# Patient Record
Sex: Male | Born: 1950 | Race: White | Hispanic: No | Marital: Married | Smoking: Never smoker
Health system: Southern US, Community
[De-identification: ages and names within clinical notes are randomized; demographics above are authoritative.]

## PROBLEM LIST (undated history)

## (undated) DIAGNOSIS — E785 Hyperlipidemia, unspecified: Secondary | ICD-10-CM

## (undated) DIAGNOSIS — N529 Male erectile dysfunction, unspecified: Secondary | ICD-10-CM

## (undated) DIAGNOSIS — N400 Enlarged prostate without lower urinary tract symptoms: Secondary | ICD-10-CM

## (undated) DIAGNOSIS — K8020 Calculus of gallbladder without cholecystitis without obstruction: Secondary | ICD-10-CM

## (undated) DIAGNOSIS — M109 Gout, unspecified: Secondary | ICD-10-CM

## (undated) DIAGNOSIS — C4491 Basal cell carcinoma of skin, unspecified: Secondary | ICD-10-CM

## (undated) DIAGNOSIS — L130 Dermatitis herpetiformis: Secondary | ICD-10-CM

## (undated) DIAGNOSIS — Z7982 Long term (current) use of aspirin: Secondary | ICD-10-CM

## (undated) DIAGNOSIS — I4711 Inappropriate sinus tachycardia, so stated: Secondary | ICD-10-CM

## (undated) DIAGNOSIS — R7303 Prediabetes: Secondary | ICD-10-CM

## (undated) DIAGNOSIS — M75101 Unspecified rotator cuff tear or rupture of right shoulder, not specified as traumatic: Secondary | ICD-10-CM

## (undated) DIAGNOSIS — K648 Other hemorrhoids: Secondary | ICD-10-CM

## (undated) DIAGNOSIS — R739 Hyperglycemia, unspecified: Secondary | ICD-10-CM

## (undated) DIAGNOSIS — K5790 Diverticulosis of intestine, part unspecified, without perforation or abscess without bleeding: Secondary | ICD-10-CM

## (undated) DIAGNOSIS — K209 Esophagitis, unspecified without bleeding: Secondary | ICD-10-CM

## (undated) DIAGNOSIS — D171 Benign lipomatous neoplasm of skin and subcutaneous tissue of trunk: Secondary | ICD-10-CM

## (undated) DIAGNOSIS — R0602 Shortness of breath: Secondary | ICD-10-CM

## (undated) DIAGNOSIS — K760 Fatty (change of) liver, not elsewhere classified: Secondary | ICD-10-CM

## (undated) DIAGNOSIS — D751 Secondary polycythemia: Secondary | ICD-10-CM

## (undated) DIAGNOSIS — K2970 Gastritis, unspecified, without bleeding: Secondary | ICD-10-CM

## (undated) DIAGNOSIS — I10 Essential (primary) hypertension: Secondary | ICD-10-CM

## (undated) DIAGNOSIS — K219 Gastro-esophageal reflux disease without esophagitis: Secondary | ICD-10-CM

## (undated) DIAGNOSIS — R000 Tachycardia, unspecified: Secondary | ICD-10-CM

## (undated) DIAGNOSIS — I2510 Atherosclerotic heart disease of native coronary artery without angina pectoris: Secondary | ICD-10-CM

## (undated) DIAGNOSIS — I429 Cardiomyopathy, unspecified: Secondary | ICD-10-CM

## (undated) DIAGNOSIS — I519 Heart disease, unspecified: Secondary | ICD-10-CM

## (undated) DIAGNOSIS — R222 Localized swelling, mass and lump, trunk: Secondary | ICD-10-CM

## (undated) DIAGNOSIS — C801 Malignant (primary) neoplasm, unspecified: Secondary | ICD-10-CM

## (undated) DIAGNOSIS — M1990 Unspecified osteoarthritis, unspecified site: Secondary | ICD-10-CM

## (undated) DIAGNOSIS — R9439 Abnormal result of other cardiovascular function study: Secondary | ICD-10-CM

## (undated) DIAGNOSIS — D45 Polycythemia vera: Secondary | ICD-10-CM

## (undated) DIAGNOSIS — G4730 Sleep apnea, unspecified: Secondary | ICD-10-CM

## (undated) DIAGNOSIS — C439 Malignant melanoma of skin, unspecified: Secondary | ICD-10-CM

## (undated) HISTORY — DX: Sleep apnea, unspecified: G47.30

## (undated) HISTORY — DX: Dermatitis herpetiformis: L13.0

## (undated) HISTORY — DX: Esophagitis, unspecified without bleeding: K20.90

## (undated) HISTORY — PX: CARPAL TUNNEL RELEASE: SHX101

## (undated) HISTORY — DX: Hyperlipidemia, unspecified: E78.5

## (undated) HISTORY — DX: Secondary polycythemia: D75.1

## (undated) HISTORY — DX: Gout, unspecified: M10.9

## (undated) HISTORY — DX: Other hemorrhoids: K64.8

## (undated) HISTORY — DX: Gastritis, unspecified, without bleeding: K29.70

## (undated) HISTORY — DX: Diverticulosis of intestine, part unspecified, without perforation or abscess without bleeding: K57.90

## (undated) HISTORY — DX: Esophagitis, unspecified: K20.9

## (undated) HISTORY — DX: Hyperglycemia, unspecified: R73.9

## (undated) HISTORY — DX: Unspecified rotator cuff tear or rupture of right shoulder, not specified as traumatic: M75.101

## (undated) HISTORY — DX: Prediabetes: R73.03

## (undated) HISTORY — DX: Male erectile dysfunction, unspecified: N52.9

## (undated) HISTORY — DX: Inappropriate sinus tachycardia, so stated: I47.11

## (undated) HISTORY — DX: Tachycardia, unspecified: R00.0

## (undated) HISTORY — DX: Unspecified osteoarthritis, unspecified site: M19.90

## (undated) HISTORY — DX: Benign prostatic hyperplasia without lower urinary tract symptoms: N40.0

---

## 2004-10-20 ENCOUNTER — Ambulatory Visit: Payer: Self-pay | Admitting: Internal Medicine

## 2007-03-28 ENCOUNTER — Ambulatory Visit: Payer: Self-pay | Admitting: Unknown Physician Specialty

## 2007-03-28 HISTORY — PX: COLONOSCOPY WITH ESOPHAGOGASTRODUODENOSCOPY (EGD): SHX5779

## 2008-11-29 ENCOUNTER — Ambulatory Visit: Payer: Self-pay | Admitting: Internal Medicine

## 2011-08-21 ENCOUNTER — Ambulatory Visit: Payer: Self-pay | Admitting: Podiatry

## 2013-04-29 ENCOUNTER — Ambulatory Visit: Payer: Self-pay | Admitting: Internal Medicine

## 2013-04-29 LAB — CBC CANCER CENTER
Basophil #: 0.1 x10 3/mm (ref 0.0–0.1)
Basophil %: 1 %
Eosinophil #: 0.2 x10 3/mm (ref 0.0–0.7)
MCH: 30.3 pg (ref 26.0–34.0)
MCHC: 33.7 g/dL (ref 32.0–36.0)
MCV: 90 fL (ref 80–100)
Monocyte #: 0.5 x10 3/mm (ref 0.2–1.0)
Neutrophil %: 75.1 %
Platelet: 253 x10 3/mm (ref 150–440)
RDW: 14.1 % (ref 11.5–14.5)
WBC: 9.5 x10 3/mm (ref 3.8–10.6)

## 2013-04-29 LAB — IRON AND TIBC
Iron Bind.Cap.(Total): 389 ug/dL (ref 250–450)
Iron: 108 ug/dL (ref 65–175)

## 2013-05-19 LAB — CBC CANCER CENTER
Basophil #: 0.1 x10 3/mm (ref 0.0–0.1)
Basophil %: 1.4 %
Eosinophil #: 0.2 x10 3/mm (ref 0.0–0.7)
Lymphocyte #: 1.4 x10 3/mm (ref 1.0–3.6)
Lymphocyte %: 20.5 %
MCH: 30.5 pg (ref 26.0–34.0)
MCHC: 33.6 g/dL (ref 32.0–36.0)
MCV: 91 fL (ref 80–100)
Monocyte %: 8.6 %
RBC: 5 10*6/uL (ref 4.40–5.90)
WBC: 6.8 x10 3/mm (ref 3.8–10.6)

## 2013-05-23 ENCOUNTER — Ambulatory Visit: Payer: Self-pay | Admitting: Internal Medicine

## 2013-06-09 LAB — CANCER CENTER HEMATOCRIT: HCT: 49.5 % (ref 40.0–52.0)

## 2013-06-22 ENCOUNTER — Ambulatory Visit: Payer: Self-pay | Admitting: Internal Medicine

## 2013-06-30 ENCOUNTER — Ambulatory Visit: Payer: Self-pay | Admitting: Internal Medicine

## 2013-07-23 ENCOUNTER — Ambulatory Visit: Payer: Self-pay | Admitting: Internal Medicine

## 2013-08-11 LAB — CANCER CENTER HEMATOCRIT: HCT: 49.7 % (ref 40.0–52.0)

## 2013-08-23 ENCOUNTER — Ambulatory Visit: Payer: Self-pay | Admitting: Internal Medicine

## 2013-08-23 ENCOUNTER — Ambulatory Visit: Payer: Self-pay

## 2013-09-01 LAB — CANCER CENTER HEMATOCRIT: HCT: 47.2 % (ref 40.0–52.0)

## 2013-09-20 ENCOUNTER — Ambulatory Visit: Payer: Self-pay | Admitting: Internal Medicine

## 2013-09-22 LAB — CBC CANCER CENTER
BASOS ABS: 0 x10 3/mm (ref 0.0–0.1)
Basophil %: 0.3 %
EOS ABS: 0.2 x10 3/mm (ref 0.0–0.7)
Eosinophil %: 3.6 %
HCT: 47.1 % (ref 40.0–52.0)
HGB: 15.2 g/dL (ref 13.0–18.0)
LYMPHS ABS: 1.6 x10 3/mm (ref 1.0–3.6)
Lymphocyte %: 25.3 %
MCH: 27.3 pg (ref 26.0–34.0)
MCHC: 32.2 g/dL (ref 32.0–36.0)
MCV: 85 fL (ref 80–100)
MONOS PCT: 8.8 %
Monocyte #: 0.6 x10 3/mm (ref 0.2–1.0)
NEUTROS ABS: 4 x10 3/mm (ref 1.4–6.5)
NEUTROS PCT: 62 %
Platelet: 273 x10 3/mm (ref 150–440)
RBC: 5.57 10*6/uL (ref 4.40–5.90)
RDW: 13.7 % (ref 11.5–14.5)
WBC: 6.5 x10 3/mm (ref 3.8–10.6)

## 2013-10-13 LAB — CANCER CENTER HEMATOCRIT: HCT: 43.7 % (ref 40.0–52.0)

## 2013-10-21 ENCOUNTER — Ambulatory Visit: Payer: Self-pay | Admitting: Internal Medicine

## 2013-11-03 LAB — CANCER CENTER HEMATOCRIT: HCT: 43.1 % (ref 40.0–52.0)

## 2013-11-20 ENCOUNTER — Ambulatory Visit: Payer: Self-pay | Admitting: Internal Medicine

## 2013-11-24 LAB — CANCER CENTER HEMATOCRIT: HCT: 43.5 % (ref 40.0–52.0)

## 2013-12-15 LAB — CANCER CENTER HEMATOCRIT: HCT: 43.2 % (ref 40.0–52.0)

## 2013-12-21 ENCOUNTER — Ambulatory Visit: Payer: Self-pay | Admitting: Internal Medicine

## 2014-01-05 LAB — CANCER CENTER HEMATOCRIT: HCT: 45.1 % (ref 40.0–52.0)

## 2014-01-20 ENCOUNTER — Ambulatory Visit: Payer: Self-pay | Admitting: Internal Medicine

## 2014-01-26 LAB — CANCER CENTER HEMATOCRIT: HCT: 46.6 % (ref 40.0–52.0)

## 2014-02-16 LAB — CANCER CENTER HEMATOCRIT: HCT: 45.3 % (ref 40.0–52.0)

## 2014-02-20 ENCOUNTER — Ambulatory Visit: Payer: Self-pay | Admitting: Internal Medicine

## 2014-03-09 LAB — CBC CANCER CENTER
Basophil #: 0.1 x10 3/mm (ref 0.0–0.1)
Basophil %: 1.5 %
Eosinophil #: 0.2 x10 3/mm (ref 0.0–0.7)
Eosinophil %: 3.1 %
HCT: 45 % (ref 40.0–52.0)
HGB: 14.5 g/dL (ref 13.0–18.0)
LYMPHS ABS: 1.6 x10 3/mm (ref 1.0–3.6)
Lymphocyte %: 24.3 %
MCH: 26.2 pg (ref 26.0–34.0)
MCHC: 32.2 g/dL (ref 32.0–36.0)
MCV: 81 fL (ref 80–100)
MONO ABS: 0.6 x10 3/mm (ref 0.2–1.0)
Monocyte %: 8.2 %
NEUTROS ABS: 4.3 x10 3/mm (ref 1.4–6.5)
NEUTROS PCT: 62.9 %
Platelet: 231 x10 3/mm (ref 150–440)
RBC: 5.54 10*6/uL (ref 4.40–5.90)
RDW: 16.9 % — ABNORMAL HIGH (ref 11.5–14.5)
WBC: 6.8 x10 3/mm (ref 3.8–10.6)

## 2014-03-23 ENCOUNTER — Ambulatory Visit: Payer: Self-pay | Admitting: Internal Medicine

## 2014-06-01 ENCOUNTER — Ambulatory Visit: Payer: Self-pay | Admitting: Internal Medicine

## 2014-06-01 LAB — CANCER CENTER HEMATOCRIT: HCT: 47.9 % (ref 40.0–52.0)

## 2014-06-22 ENCOUNTER — Ambulatory Visit: Payer: Self-pay | Admitting: Internal Medicine

## 2014-08-24 ENCOUNTER — Ambulatory Visit: Payer: Self-pay | Admitting: Internal Medicine

## 2014-08-24 LAB — CANCER CENTER HEMATOCRIT: HCT: 47.2 % (ref 40.0–52.0)

## 2014-09-21 ENCOUNTER — Ambulatory Visit
Admit: 2014-09-21 | Disposition: A | Discharge status: Home / Self Care | Payer: Self-pay | Attending: Internal Medicine | Admitting: Internal Medicine

## 2014-11-16 ENCOUNTER — Ambulatory Visit
Admit: 2014-11-16 | Disposition: A | Discharge status: Home / Self Care | Payer: Self-pay | Attending: Internal Medicine | Admitting: Internal Medicine

## 2014-11-16 LAB — HEMATOCRIT: HCT: 44.5 % (ref 40.0–52.0)

## 2015-01-31 ENCOUNTER — Telehealth: Payer: Self-pay | Admitting: *Deleted

## 2015-01-31 ENCOUNTER — Other Ambulatory Visit: Payer: Self-pay | Admitting: *Deleted

## 2015-01-31 DIAGNOSIS — D751 Secondary polycythemia: Secondary | ICD-10-CM

## 2015-01-31 NOTE — Telephone Encounter (Signed)
Pt called stating that the [facility] blood work did not work well from side of [place] and he will just come to cancer center and get labs drawn.  Orders entered for the appt 7/19. Pt aware that the time for labs is 8:45. And he will see md after and if he needs phlebotomy then he will get it after he sees md.

## 2015-02-08 ENCOUNTER — Inpatient Hospital Stay: Payer: 59

## 2015-02-08 ENCOUNTER — Inpatient Hospital Stay: Payer: 59 | Attending: Internal Medicine | Admitting: Internal Medicine

## 2015-02-08 VITALS — BP 135/86 | HR 67 | Temp 97.6°F | Resp 18 | Ht 65.0 in | Wt 207.4 lb

## 2015-02-08 DIAGNOSIS — R5383 Other fatigue: Secondary | ICD-10-CM | POA: Diagnosis not present

## 2015-02-08 DIAGNOSIS — E785 Hyperlipidemia, unspecified: Secondary | ICD-10-CM | POA: Diagnosis not present

## 2015-02-08 DIAGNOSIS — K760 Fatty (change of) liver, not elsewhere classified: Secondary | ICD-10-CM

## 2015-02-08 DIAGNOSIS — Z8719 Personal history of other diseases of the digestive system: Secondary | ICD-10-CM

## 2015-02-08 DIAGNOSIS — N400 Enlarged prostate without lower urinary tract symptoms: Secondary | ICD-10-CM | POA: Diagnosis not present

## 2015-02-08 DIAGNOSIS — M1990 Unspecified osteoarthritis, unspecified site: Secondary | ICD-10-CM | POA: Diagnosis not present

## 2015-02-08 DIAGNOSIS — Z8639 Personal history of other endocrine, nutritional and metabolic disease: Secondary | ICD-10-CM | POA: Diagnosis not present

## 2015-02-08 DIAGNOSIS — N529 Male erectile dysfunction, unspecified: Secondary | ICD-10-CM

## 2015-02-08 DIAGNOSIS — D751 Secondary polycythemia: Secondary | ICD-10-CM

## 2015-02-08 DIAGNOSIS — I10 Essential (primary) hypertension: Secondary | ICD-10-CM

## 2015-02-08 LAB — CBC WITH DIFFERENTIAL/PLATELET
BASOS ABS: 0.1 10*3/uL (ref 0–0.1)
Basophils Relative: 1 %
EOS ABS: 0.2 10*3/uL (ref 0–0.7)
Eosinophils Relative: 3 %
HEMATOCRIT: 48.1 % (ref 40.0–52.0)
HEMOGLOBIN: 15.4 g/dL (ref 13.0–18.0)
Lymphocytes Relative: 23 %
Lymphs Abs: 1.5 10*3/uL (ref 1.0–3.6)
MCH: 26.1 pg (ref 26.0–34.0)
MCHC: 32.1 g/dL (ref 32.0–36.0)
MCV: 81.5 fL (ref 80.0–100.0)
Monocytes Absolute: 0.5 10*3/uL (ref 0.2–1.0)
Monocytes Relative: 7 %
NEUTROS ABS: 4.2 10*3/uL (ref 1.4–6.5)
Neutrophils Relative %: 66 %
Platelets: 232 10*3/uL (ref 150–440)
RBC: 5.91 MIL/uL — AB (ref 4.40–5.90)
RDW: 16.1 % — AB (ref 11.5–14.5)
WBC: 6.4 10*3/uL (ref 3.8–10.6)

## 2015-02-26 NOTE — Progress Notes (Signed)
[place]  [phone] Fax:(336) [phone]     ID: Jorge Mose Goodroe Sr. 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  Started phlebotomy treatment on 04/29/13.  Labs done on 04/29/13 -  Hemoglobin 17.4, hematocrit 53%, WBC 9500 with unremarkable differential, platelets 253, iron study normal. Carboxyhemoglobin 0.9, serum EPO 6.3. [id number] mutation with reflex to exon 12 analysis negative. Ultrasound abdomen limited study reports mild hepatic steatosis, no hepatosplenomegaly.     HISTORY OF PRESENT ILLNESS:  Patient returns for continued hematology followup. He had hematocrit monitoredin-between and it has fluctuated in the range between 44.5-48.1 today. States that he does have chronic fatigue but otherwise denies any dizziness or lightheadedness after getting phlebotomy treatments. Denies any history of smoking or secondhand smoke exposure. States that he takes adequate oral fluids and water intake and denies any symptoms to suggest dehydration. He denies any known history of chronic lung disease or sleep apnea. Appetite is good. No new bone pains. Denies any headaches, other neurological symptoms or facial flushing.    REVIEW OF SYSTEMS:   ROS As in HPI above. In addition, no fever, chills or sweats. No new headaches or focal weakness.  No new mood disturbances. No  sore throat, cough, shortness of breath, sputum, hemoptysis or chest pain. No dizziness or palpitation. No abdominal pain, constipation, diarrhea, dysuria or hematuria. No new skin rash or bleeding symptoms. No new paresthesias in extremities.   PAST MEDICAL HISTORY: Reviewed.          Hypertension  Hyperlipidemia  Hyperglycemia/prediabetes  BPH  History of internal hemorrhoids  Osteoarthritis, right rotator cuff tear  Environmental allergies  Erectile dysfunction  History of esophagitis/gastritis  Diverticulosis  Gout  PAST SURGICAL HISTORY: Reviewed. As above  FAMILY HISTORY: Reviewed. Sister had CVA in her 65s, brother died from prostate cancer at age 5.  Denies hematological disorders including polycythemia  SOCIAL HISTORY: Reviewed. Denies smoking.  Occasional alcohol intake.  Denies recreational drug usage.  Physically active and ambulatory.   No current outpatient prescriptions on file.   No current facility-administered medications for this visit.    PHYSICAL EXAM: Filed Vitals:   02/08/15 0929  BP: 135/86  Pulse: 67  Temp: 97.6 F (36.4 C)  Resp: 18     Body mass index is 34.52 kg/(m^2).      GENERAL: Patient is alert and oriented and in no acute distress. There is no icterus. HEENT: EOMs intact. No cervical lymphadenopathy. CVS: S1S2, regular LUNGS: Bilaterally clear to auscultation, no rhonchi. ABDOMEN: Soft, nontender. No hepatosplenomegaly clinically.  EXTREMITIES: No pedal edema.  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 Also other possible etiology could be taking long hot steamy bath daily.  Started phlebotomy treatment on 04/29/13.  Otherwise workup done shows that Carboxyhemoglobin, serum EPO, [id number] mutation with reflex to exon 12 analysis are all unremarkable  -  Reviewed labs from today and recent, and d/w patient. Overall he is doing steady clinically,  hematocrit continues to remain in steady range. Patient agreeable to continue on current phlebotomy protocol. states that he wants to continue to keep slightly higher target hematocrit at 47 since phlebotomies do make him feel tired. He has not had any thromboembolic phenomena so far. Plan therefore is to continue to monitor hematocrit once every few weeks and will phlebotomy 300 mL if t is 47 or higher. Patient encouraged to take baby aspirin 81 mg daily. Next MD followup at 48 weeks with repeat labs and make further treatment planning.   In between visits, he was advised to call or come to ER in case of any new symptoms or acute sickness.  He is agreeable to this plan.    Leia Alf, MD   02/26/2015 11:10 AM

## 2015-05-31 ENCOUNTER — Inpatient Hospital Stay: Payer: 59 | Attending: Family Medicine

## 2015-05-31 ENCOUNTER — Inpatient Hospital Stay: Payer: 59

## 2015-05-31 DIAGNOSIS — D751 Secondary polycythemia: Secondary | ICD-10-CM | POA: Insufficient documentation

## 2015-05-31 LAB — HEMATOCRIT: HCT: 48.2 % (ref 40.0–52.0)

## 2015-09-02 IMAGING — US ABDOMEN ULTRASOUND LIMITED
1 series · 14 of 25 positions shown · non-contrast
Comparison: none

REASON FOR EXAM: Polycythemia  Eval Heptasplenomegaly
COMMENTS:

[Series 1: abdomen ultrasound limited · 0.31mm/px · 14 of 34 slices shown]
[im 1/34]
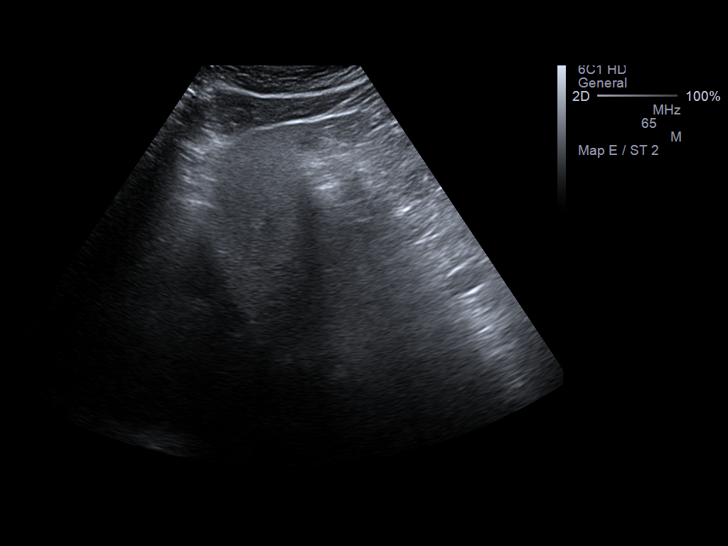
[im 3/34]
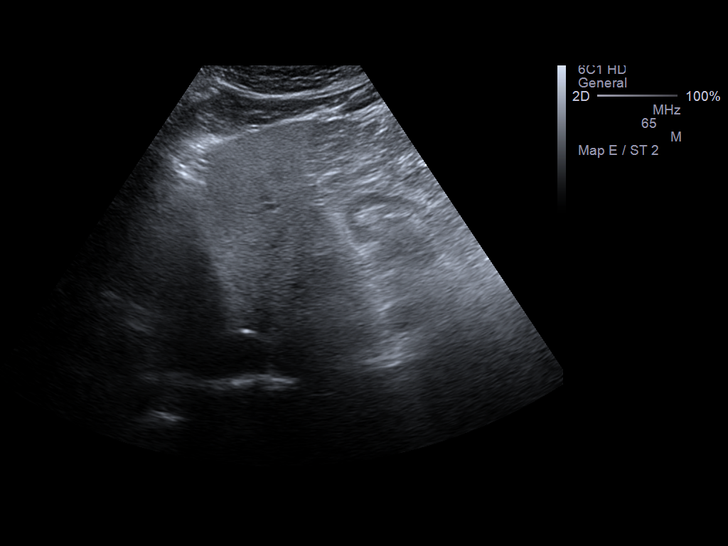
[im 6/34]
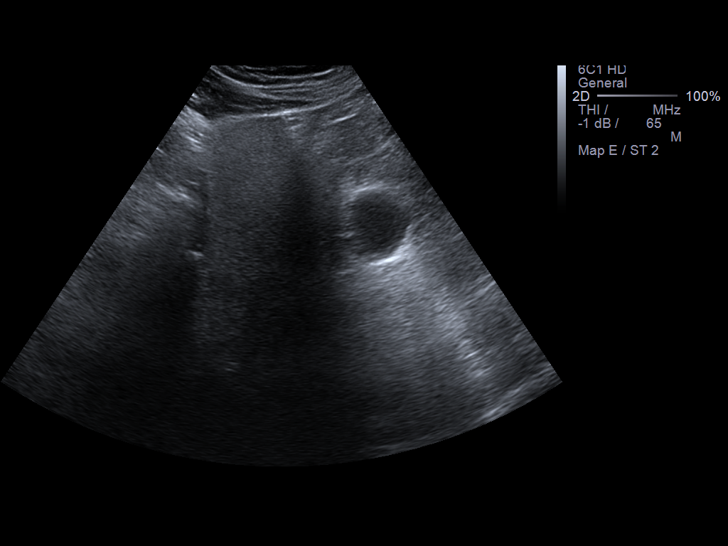
[im 9/34]
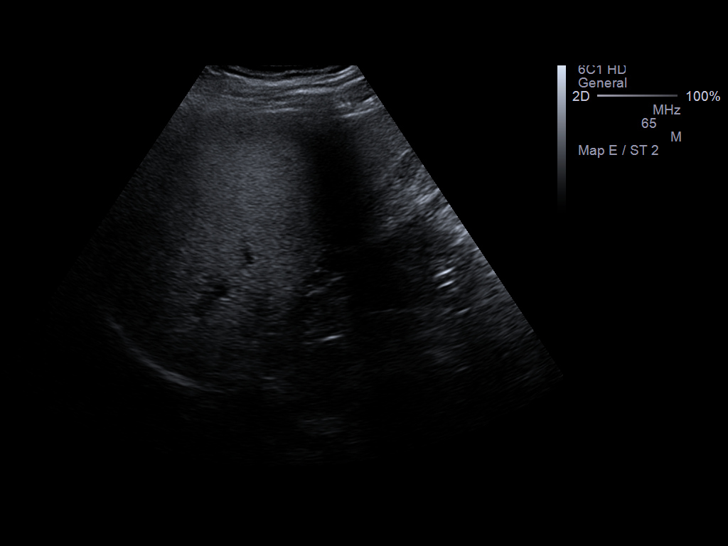
[im 12/34]
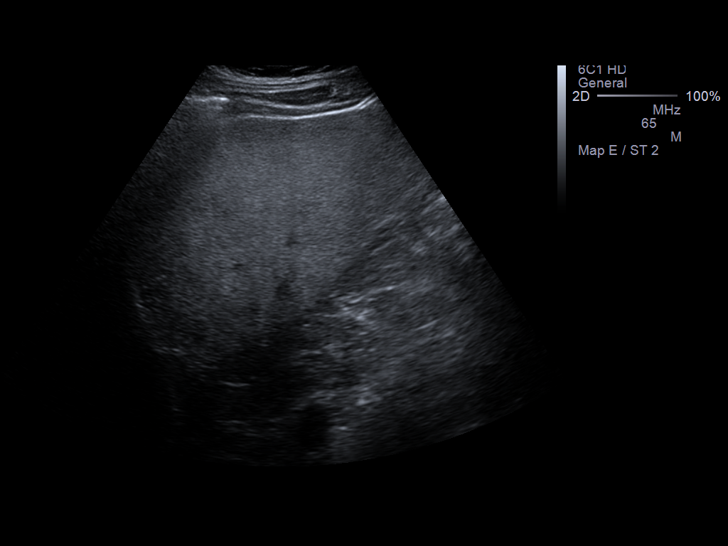
[im 13/34]
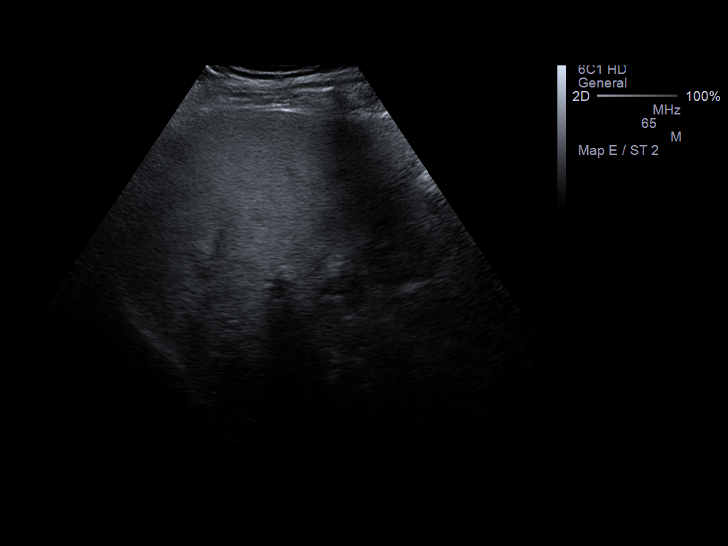
[im 16/34]
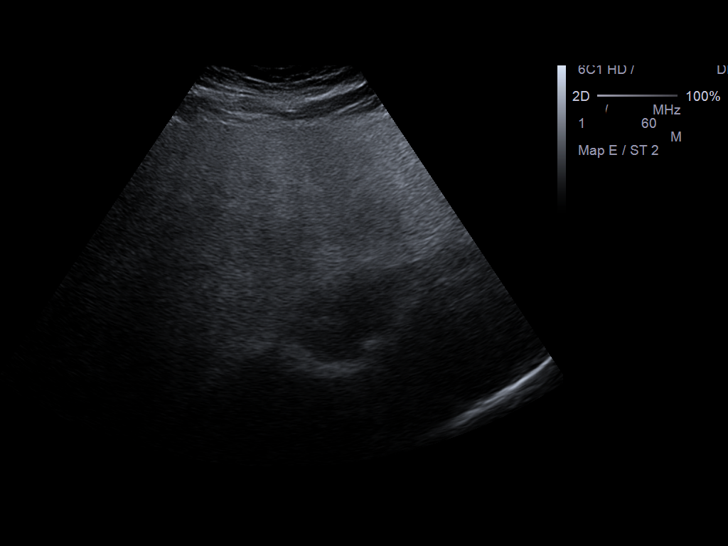
[im 18/34]
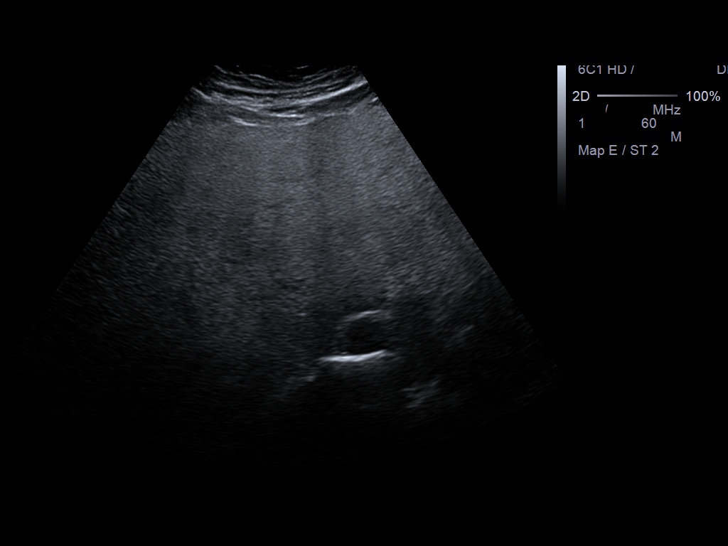
[im 21/34]
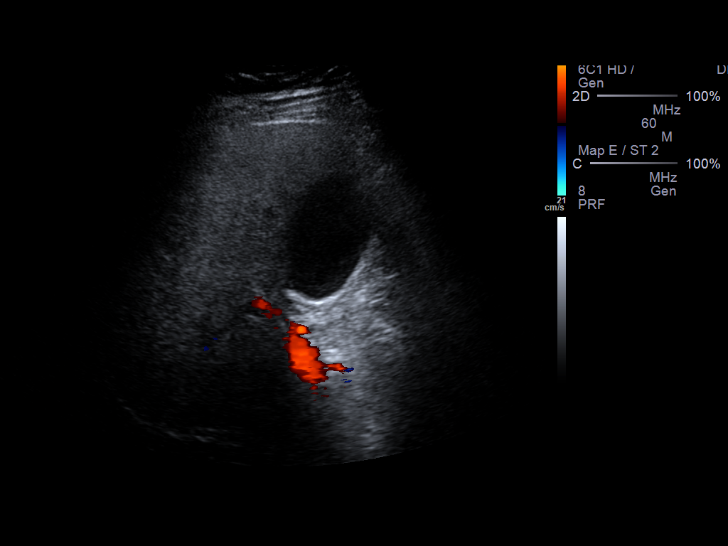
[im 23/34]
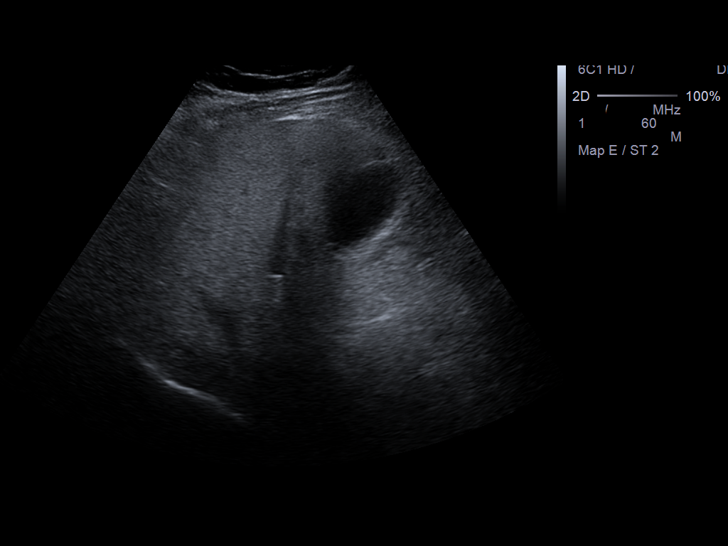
[im 25/34]
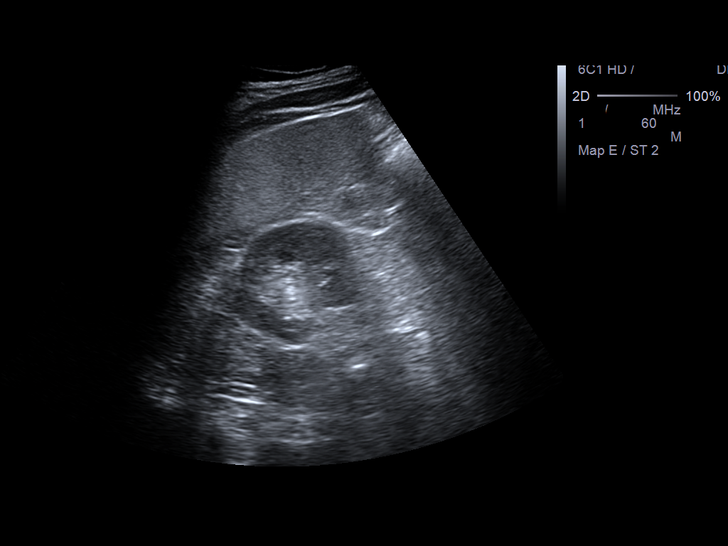
[im 28/34]
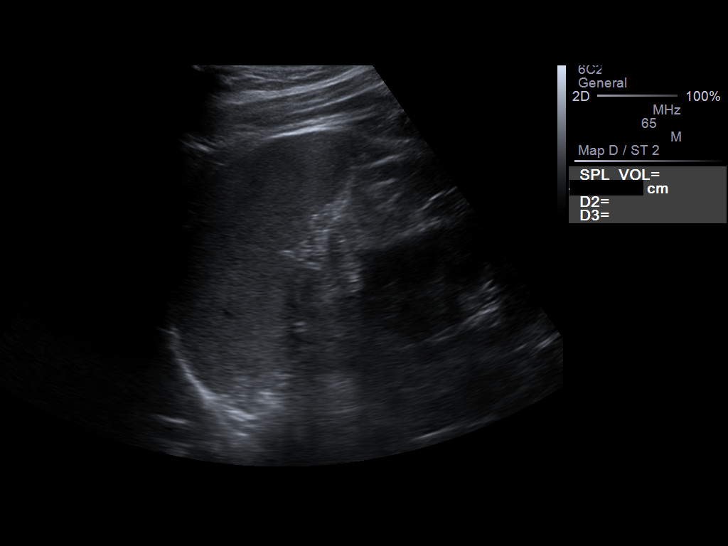
[im 31/34]
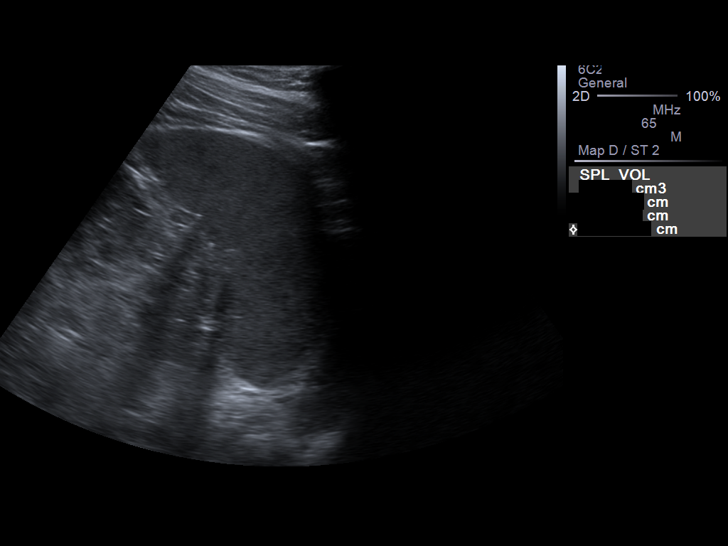
[im 34/34]
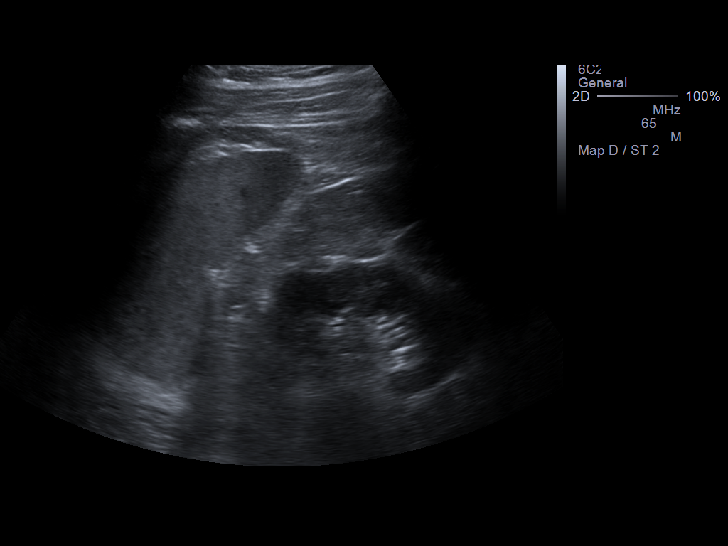

[14 of 25 positions shown; findings below may reference images not displayed]

PROCEDURE:     US  - US ABDOMEN LIMITED SURVEY  - May 01, 2013  [DATE]

RESULT:     Ultrasound of the abdomen to evaluate hepatosplenomegaly shows
the liver length is 17.17 cm. The echotexture is mildly increased. There is
no discrete mass or ductal dilation. The portal vein is patent with
appropriate flow. Spleen measures 8.04 x 4.25 x 10.51 cm and is homogeneous.
IMPRESSION: No evidence of hepatosplenomegaly. Mild hepatic steatosis.

[REDACTED]

## 2015-09-20 ENCOUNTER — Inpatient Hospital Stay: Payer: 59

## 2015-09-21 ENCOUNTER — Inpatient Hospital Stay: Payer: 59 | Attending: Internal Medicine | Admitting: *Deleted

## 2015-09-21 ENCOUNTER — Inpatient Hospital Stay: Payer: 59

## 2015-09-21 DIAGNOSIS — D751 Secondary polycythemia: Secondary | ICD-10-CM | POA: Diagnosis present

## 2015-09-21 LAB — HEMATOCRIT: HCT: 47.3 % (ref 40.0–52.0)

## 2015-09-21 NOTE — Progress Notes (Unsigned)
HCT 47.3 please see results

## 2016-01-10 ENCOUNTER — Ambulatory Visit: Payer: 59 | Admitting: Internal Medicine

## 2016-01-10 ENCOUNTER — Inpatient Hospital Stay: Payer: 59

## 2016-01-10 ENCOUNTER — Other Ambulatory Visit: Payer: Self-pay

## 2016-01-10 ENCOUNTER — Inpatient Hospital Stay: Payer: 59 | Attending: Family Medicine | Admitting: Family Medicine

## 2016-01-10 VITALS — BP 143/99 | HR 85 | Temp 99.1°F | Resp 16 | Wt 205.0 lb

## 2016-01-10 VITALS — BP 135/87 | HR 84 | Resp 18

## 2016-01-10 DIAGNOSIS — M109 Gout, unspecified: Secondary | ICD-10-CM | POA: Diagnosis not present

## 2016-01-10 DIAGNOSIS — K209 Esophagitis, unspecified without bleeding: Secondary | ICD-10-CM | POA: Insufficient documentation

## 2016-01-10 DIAGNOSIS — K2970 Gastritis, unspecified, without bleeding: Secondary | ICD-10-CM | POA: Insufficient documentation

## 2016-01-10 DIAGNOSIS — I10 Essential (primary) hypertension: Secondary | ICD-10-CM | POA: Diagnosis not present

## 2016-01-10 DIAGNOSIS — R739 Hyperglycemia, unspecified: Secondary | ICD-10-CM | POA: Diagnosis not present

## 2016-01-10 DIAGNOSIS — K5790 Diverticulosis of intestine, part unspecified, without perforation or abscess without bleeding: Secondary | ICD-10-CM | POA: Insufficient documentation

## 2016-01-10 DIAGNOSIS — D751 Secondary polycythemia: Secondary | ICD-10-CM | POA: Diagnosis present

## 2016-01-10 DIAGNOSIS — N529 Male erectile dysfunction, unspecified: Secondary | ICD-10-CM | POA: Diagnosis not present

## 2016-01-10 DIAGNOSIS — M1990 Unspecified osteoarthritis, unspecified site: Secondary | ICD-10-CM | POA: Insufficient documentation

## 2016-01-10 DIAGNOSIS — Z79899 Other long term (current) drug therapy: Secondary | ICD-10-CM | POA: Insufficient documentation

## 2016-01-10 DIAGNOSIS — K648 Other hemorrhoids: Secondary | ICD-10-CM | POA: Insufficient documentation

## 2016-01-10 DIAGNOSIS — N400 Enlarged prostate without lower urinary tract symptoms: Secondary | ICD-10-CM | POA: Insufficient documentation

## 2016-01-10 DIAGNOSIS — Z8546 Personal history of malignant neoplasm of prostate: Secondary | ICD-10-CM | POA: Diagnosis not present

## 2016-01-10 DIAGNOSIS — R5383 Other fatigue: Secondary | ICD-10-CM | POA: Insufficient documentation

## 2016-01-10 DIAGNOSIS — E785 Hyperlipidemia, unspecified: Secondary | ICD-10-CM | POA: Insufficient documentation

## 2016-01-10 LAB — CBC WITH DIFFERENTIAL/PLATELET
BASOS ABS: 0.1 10*3/uL (ref 0–0.1)
BASOS PCT: 1 %
Eosinophils Absolute: 0.2 10*3/uL (ref 0–0.7)
Eosinophils Relative: 3 %
HEMATOCRIT: 49.7 % (ref 40.0–52.0)
HEMOGLOBIN: 16.6 g/dL (ref 13.0–18.0)
Lymphocytes Relative: 26 %
Lymphs Abs: 1.7 10*3/uL (ref 1.0–3.6)
MCH: 27.5 pg (ref 26.0–34.0)
MCHC: 33.4 g/dL (ref 32.0–36.0)
MCV: 82.4 fL (ref 80.0–100.0)
MONOS PCT: 8 %
Monocytes Absolute: 0.5 10*3/uL (ref 0.2–1.0)
NEUTROS PCT: 62 %
Neutro Abs: 4 10*3/uL (ref 1.4–6.5)
Platelets: 245 10*3/uL (ref 150–440)
RBC: 6.03 MIL/uL — ABNORMAL HIGH (ref 4.40–5.90)
RDW: 16 % — AB (ref 11.5–14.5)
WBC: 6.5 10*3/uL (ref 3.8–10.6)

## 2016-01-10 NOTE — Progress Notes (Signed)
[facility]  [phone] [phone]) [phone]     ID: Jorge Mose Gumina Sr. 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  Started phlebotomy treatment on 04/29/13.  Labs done on 04/29/13 -  Hemoglobin 17.4, hematocrit 53%, WBC 9500 with unremarkable differential, platelets 253, iron study normal. Carboxyhemoglobin 0.9, serum EPO 6.3. [id number] mutation with reflex to exon 12 analysis negative. Ultrasound abdomen limited study reports mild hepatic steatosis, no hepatosplenomegaly.     HISTORY OF PRESENT ILLNESS:  Patient is here for further follow-up and treatment consideration regarding erythrocytosis/polycythemia. Patient was originally diagnosed in 2014 and started treatment under the direction of Dr. Ma Hillock. Dr. Ma Hillock and he established a goal hematocrit of around 47. Patient does report having some chronic fatigue but otherwise denies any complaints of dizziness, lightheadedness, or headaches following phlebotomies. Discussed with patient if he became dizzy that we could to a replacement of IV fluids. Patient also denies any shortness of breath, hemoptysis, lower extremity swelling. Overall he reports feeling very well and denies any acute complaints.  REVIEW OF SYSTEMS:   Review of Systems  Constitutional: Positive for malaise/fatigue. Negative for fever, chills, weight loss and diaphoresis.       At times  HENT: Negative.   Eyes: Negative.   Respiratory: Negative for cough, hemoptysis, sputum production, shortness of breath and wheezing.   Cardiovascular: Negative for chest pain, palpitations, orthopnea, claudication, leg swelling and PND.  Gastrointestinal: Negative for  heartburn, nausea, vomiting, abdominal pain, diarrhea, constipation, blood in stool and melena.  Genitourinary: Negative.   Musculoskeletal: Negative.   Skin: Negative.   Neurological: Negative for dizziness, tingling, focal weakness, seizures and weakness.  Endo/Heme/Allergies: Does not bruise/bleed easily.  Psychiatric/Behavioral: Negative for depression. The patient is not nervous/anxious and does not have insomnia.    As in HPI above. In addition, no fever, chills or sweats. No new headaches or focal weakness.  No new mood disturbances. No  sore throat, cough, shortness of breath, sputum, hemoptysis or chest pain. No dizziness or palpitation. No abdominal pain, constipation, diarrhea, dysuria or hematuria. No new skin rash or bleeding symptoms. No new paresthesias in extremities.   PAST MEDICAL HISTORY: Reviewed.         Hypertension  Hyperlipidemia  Hyperglycemia/prediabetes  BPH  History of internal hemorrhoids  Osteoarthritis, right rotator cuff tear  Environmental allergies  Erectile dysfunction  History of esophagitis/gastritis  Diverticulosis  Gout  PAST SURGICAL HISTORY: Reviewed. As above  FAMILY HISTORY: Reviewed. Sister had CVA in her 92s, brother died from prostate cancer at age 57.  Denies hematological disorders including polycythemia  SOCIAL HISTORY: Reviewed. Denies smoking.  Occasional alcohol intake.  Denies recreational drug usage.  Physically active and ambulatory.   Current Outpatient Prescriptions  Medication Sig Dispense Refill  . acetaminophen (TYLENOL) 500 MG tablet Take by mouth.    Marland Kitchen allopurinol (ZYLOPRIM) 300 MG tablet     . CIALIS 20 MG tablet TAKE 1 TABLET BY MOUTH ONCE DAILY AS NEEDED FOR ERECTILE DYSFUNCTION  5  . colchicine 0.6 MG tablet Take by mouth.    . finasteride (PROSCAR) 5 MG tablet     . omeprazole (PRILOSEC) 20 MG capsule      No current facility-administered medications for this visit.  PHYSICAL EXAM: Filed Vitals:    01/10/16 0958  BP: 143/99  Pulse: 85  Temp: 99.1 F (37.3 C)  Resp: 16     Body mass index is 34.11 kg/(m^2).      GENERAL: Patient is alert and oriented and in no acute distress. There is no icterus. HEENT: EOMs intact. No cervical lymphadenopathy. CVS: S1S2, regular LUNGS: Bilaterally clear to auscultation, no rhonchi. ABDOMEN: Soft, nontender. No hepatosplenomegaly clinically.  EXTREMITIES: No pedal edema.  LAB RESULTS: Lab Results  Component Value Date   WBC 6.5 01/10/2016   NEUTROABS 4.0 01/10/2016   HGB 16.6 01/10/2016   HCT 49.7 01/10/2016   MCV 82.4 01/10/2016   PLT 245 01/10/2016     ASSESSMENT / PLAN:   1. Progressive Erythrocytosis. On initial diagnosis hemoglobin 17.9 g/dL, hematocrit 52.3% on CBC done 04/17/13. 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 Started phlebotomy treatment on 04/29/13.  Otherwise workup done shows that Carboxyhemoglobin, serum EPO, [id number] mutation with reflex to exon 12 analysis are all unremarkable.  Reviewed labs from today and previous visits. Overall he is doing steady clinically, hematocrit continues to remain in steady range. Patient agreeable to continue on current phlebotomy protocol. We will continue with a target hematocrit at 47. Plan therefore is to continue to monitor hematocrit once every 12 weeks and will perform phlebotomy as 300 mL if hct is 47 or higher. Patient continues to take baby aspirin 81 mg daily.   Next MD followup in approximately one year with repeat labs and make further treatment planning.  Patient understands to call our office if he develops any worsening symptoms or concerns.  In between visits, he was advised to call or come to ER in case of any new symptoms or acute sickness.  He is agreeable to this plan.   Dr. Grayland Ormond was available for consultation and review of plan of care for this patient.  Evlyn Kanner, NP   01/10/2016  10:08 AM

## 2016-04-03 ENCOUNTER — Inpatient Hospital Stay: Payer: 59

## 2016-04-03 ENCOUNTER — Other Ambulatory Visit: Payer: Self-pay | Admitting: Oncology

## 2016-04-03 ENCOUNTER — Inpatient Hospital Stay: Payer: 59 | Attending: Internal Medicine

## 2016-04-03 VITALS — BP 131/87 | HR 90 | Temp 98.7°F | Resp 18

## 2016-04-03 DIAGNOSIS — D751 Secondary polycythemia: Secondary | ICD-10-CM | POA: Diagnosis not present

## 2016-04-03 LAB — CBC WITH DIFFERENTIAL/PLATELET
BASOS ABS: 0.1 10*3/uL (ref 0–0.1)
Basophils Relative: 1 %
Eosinophils Absolute: 0.2 10*3/uL (ref 0–0.7)
Eosinophils Relative: 2 %
HEMATOCRIT: 49.6 % (ref 40.0–52.0)
HEMOGLOBIN: 16.6 g/dL (ref 13.0–18.0)
LYMPHS ABS: 2.3 10*3/uL (ref 1.0–3.6)
LYMPHS PCT: 25 %
MCH: 27.4 pg (ref 26.0–34.0)
MCHC: 33.4 g/dL (ref 32.0–36.0)
MCV: 82 fL (ref 80.0–100.0)
Monocytes Absolute: 0.5 10*3/uL (ref 0.2–1.0)
Monocytes Relative: 5 %
NEUTROS ABS: 6.4 10*3/uL (ref 1.4–6.5)
NEUTROS PCT: 67 %
Platelets: 278 10*3/uL (ref 150–440)
RBC: 6.05 MIL/uL — AB (ref 4.40–5.90)
RDW: 15.3 % — ABNORMAL HIGH (ref 11.5–14.5)
WBC: 9.6 10*3/uL (ref 3.8–10.6)

## 2016-06-06 DIAGNOSIS — I5189 Other ill-defined heart diseases: Secondary | ICD-10-CM

## 2016-06-06 DIAGNOSIS — I429 Cardiomyopathy, unspecified: Secondary | ICD-10-CM

## 2016-06-06 HISTORY — DX: Other ill-defined heart diseases: I51.89

## 2016-06-06 HISTORY — DX: Cardiomyopathy, unspecified: I42.9

## 2016-06-18 DIAGNOSIS — I519 Heart disease, unspecified: Secondary | ICD-10-CM | POA: Insufficient documentation

## 2016-06-26 ENCOUNTER — Inpatient Hospital Stay: Payer: 59 | Attending: Internal Medicine

## 2016-06-26 ENCOUNTER — Inpatient Hospital Stay: Payer: 59

## 2016-06-26 VITALS — BP 110/72 | HR 112 | Resp 20

## 2016-06-26 DIAGNOSIS — D751 Secondary polycythemia: Secondary | ICD-10-CM | POA: Diagnosis present

## 2016-06-26 LAB — HEMATOCRIT: HEMATOCRIT: 48.3 % (ref 40.0–52.0)

## 2016-06-28 DIAGNOSIS — R0602 Shortness of breath: Secondary | ICD-10-CM | POA: Insufficient documentation

## 2016-06-28 DIAGNOSIS — G4733 Obstructive sleep apnea (adult) (pediatric): Secondary | ICD-10-CM | POA: Insufficient documentation

## 2016-07-25 DIAGNOSIS — I10 Essential (primary) hypertension: Secondary | ICD-10-CM | POA: Insufficient documentation

## 2016-09-18 ENCOUNTER — Inpatient Hospital Stay: Payer: 59 | Attending: Internal Medicine | Admitting: *Deleted

## 2016-09-18 ENCOUNTER — Other Ambulatory Visit: Payer: Self-pay | Admitting: Internal Medicine

## 2016-09-18 ENCOUNTER — Inpatient Hospital Stay: Payer: 59

## 2016-09-18 DIAGNOSIS — D751 Secondary polycythemia: Secondary | ICD-10-CM | POA: Insufficient documentation

## 2016-09-18 LAB — CBC WITH DIFFERENTIAL/PLATELET
Basophils Absolute: 0.1 10*3/uL (ref 0–0.1)
Basophils Relative: 1 %
Eosinophils Absolute: 0.3 10*3/uL (ref 0–0.7)
Eosinophils Relative: 4 %
HEMATOCRIT: 46.3 % (ref 40.0–52.0)
HEMOGLOBIN: 15.6 g/dL (ref 13.0–18.0)
LYMPHS ABS: 2.1 10*3/uL (ref 1.0–3.6)
Lymphocytes Relative: 29 %
MCH: 26.6 pg (ref 26.0–34.0)
MCHC: 33.7 g/dL (ref 32.0–36.0)
MCV: 78.9 fL — ABNORMAL LOW (ref 80.0–100.0)
Monocytes Absolute: 0.5 10*3/uL (ref 0.2–1.0)
Monocytes Relative: 7 %
NEUTROS ABS: 4.2 10*3/uL (ref 1.4–6.5)
NEUTROS PCT: 59 %
Platelets: 306 10*3/uL (ref 150–440)
RBC: 5.87 MIL/uL (ref 4.40–5.90)
RDW: 15.5 % — ABNORMAL HIGH (ref 11.5–14.5)
WBC: 7.2 10*3/uL (ref 3.8–10.6)

## 2016-09-18 NOTE — Progress Notes (Signed)
HCT 46.3 today.  Phlebotomy not needed per standing orders.

## 2016-12-11 ENCOUNTER — Inpatient Hospital Stay: Payer: 59

## 2016-12-11 ENCOUNTER — Inpatient Hospital Stay: Payer: 59 | Attending: Internal Medicine

## 2016-12-11 DIAGNOSIS — D751 Secondary polycythemia: Secondary | ICD-10-CM | POA: Insufficient documentation

## 2016-12-11 LAB — CBC WITH DIFFERENTIAL/PLATELET
Basophils Absolute: 0.1 10*3/uL (ref 0–0.1)
Basophils Relative: 1 %
EOS PCT: 3 %
Eosinophils Absolute: 0.3 10*3/uL (ref 0–0.7)
HCT: 43.5 % (ref 40.0–52.0)
Hemoglobin: 14.8 g/dL (ref 13.0–18.0)
LYMPHS ABS: 2 10*3/uL (ref 1.0–3.6)
LYMPHS PCT: 26 %
MCH: 27.4 pg (ref 26.0–34.0)
MCHC: 34.1 g/dL (ref 32.0–36.0)
MCV: 80.2 fL (ref 80.0–100.0)
MONOS PCT: 7 %
Monocytes Absolute: 0.6 10*3/uL (ref 0.2–1.0)
Neutro Abs: 5 10*3/uL (ref 1.4–6.5)
Neutrophils Relative %: 63 %
Platelets: 285 10*3/uL (ref 150–440)
RBC: 5.42 MIL/uL (ref 4.40–5.90)
RDW: 15.9 % — ABNORMAL HIGH (ref 11.5–14.5)
WBC: 8 10*3/uL (ref 3.8–10.6)

## 2017-01-09 ENCOUNTER — Other Ambulatory Visit: Payer: 59

## 2017-01-09 ENCOUNTER — Ambulatory Visit: Payer: 59 | Admitting: Internal Medicine

## 2017-01-14 ENCOUNTER — Other Ambulatory Visit: Payer: Self-pay | Admitting: *Deleted

## 2017-01-14 DIAGNOSIS — D751 Secondary polycythemia: Secondary | ICD-10-CM

## 2017-01-18 ENCOUNTER — Inpatient Hospital Stay (HOSPITAL_BASED_OUTPATIENT_CLINIC_OR_DEPARTMENT_OTHER): Payer: 59 | Admitting: Internal Medicine

## 2017-01-18 ENCOUNTER — Inpatient Hospital Stay: Payer: 59 | Attending: Internal Medicine

## 2017-01-18 ENCOUNTER — Encounter: Payer: Self-pay | Admitting: *Deleted

## 2017-01-18 ENCOUNTER — Telehealth: Payer: Self-pay | Admitting: Internal Medicine

## 2017-01-18 VITALS — BP 132/85 | HR 72 | Temp 97.9°F | Resp 16 | Wt 204.5 lb

## 2017-01-18 DIAGNOSIS — Z79899 Other long term (current) drug therapy: Secondary | ICD-10-CM | POA: Diagnosis not present

## 2017-01-18 DIAGNOSIS — R7303 Prediabetes: Secondary | ICD-10-CM | POA: Diagnosis not present

## 2017-01-18 DIAGNOSIS — E785 Hyperlipidemia, unspecified: Secondary | ICD-10-CM | POA: Insufficient documentation

## 2017-01-18 DIAGNOSIS — D751 Secondary polycythemia: Secondary | ICD-10-CM

## 2017-01-18 DIAGNOSIS — G4733 Obstructive sleep apnea (adult) (pediatric): Secondary | ICD-10-CM

## 2017-01-18 DIAGNOSIS — Z7982 Long term (current) use of aspirin: Secondary | ICD-10-CM | POA: Diagnosis not present

## 2017-01-18 DIAGNOSIS — N400 Enlarged prostate without lower urinary tract symptoms: Secondary | ICD-10-CM | POA: Insufficient documentation

## 2017-01-18 DIAGNOSIS — M1990 Unspecified osteoarthritis, unspecified site: Secondary | ICD-10-CM | POA: Insufficient documentation

## 2017-01-18 DIAGNOSIS — K648 Other hemorrhoids: Secondary | ICD-10-CM

## 2017-01-18 DIAGNOSIS — M109 Gout, unspecified: Secondary | ICD-10-CM | POA: Insufficient documentation

## 2017-01-18 DIAGNOSIS — Z8719 Personal history of other diseases of the digestive system: Secondary | ICD-10-CM | POA: Diagnosis not present

## 2017-01-18 LAB — CBC WITH DIFFERENTIAL/PLATELET
BASOS ABS: 0.1 10*3/uL (ref 0–0.1)
Basophils Relative: 1 %
Eosinophils Absolute: 0.2 10*3/uL (ref 0–0.7)
Eosinophils Relative: 3 %
HEMATOCRIT: 47.3 % (ref 40.0–52.0)
Hemoglobin: 15.9 g/dL (ref 13.0–18.0)
Lymphocytes Relative: 30 %
Lymphs Abs: 2.1 10*3/uL (ref 1.0–3.6)
MCH: 27.5 pg (ref 26.0–34.0)
MCHC: 33.5 g/dL (ref 32.0–36.0)
MCV: 82 fL (ref 80.0–100.0)
Monocytes Absolute: 0.6 10*3/uL (ref 0.2–1.0)
Monocytes Relative: 8 %
NEUTROS ABS: 4 10*3/uL (ref 1.4–6.5)
Neutrophils Relative %: 58 %
Platelets: 278 10*3/uL (ref 150–440)
RBC: 5.77 MIL/uL (ref 4.40–5.90)
RDW: 15.6 % — ABNORMAL HIGH (ref 11.5–14.5)
WBC: 6.9 10*3/uL (ref 3.8–10.6)

## 2017-01-18 NOTE — Telephone Encounter (Signed)
Dr. B, this has been added

## 2017-01-18 NOTE — Progress Notes (Signed)
[facility] OFFICE PROGRESS NOTE  Patient Care Team: Idelle Crouch, MD as PCP - General (Internal Medicine)  Cancer Staging No matching staging information was found for the patient.  # ERYTHROCYTOSIS: Erythrocytosis  (hemoglobin 17.9 g/dL, hematocrit 52.3% on CBC done 04/17/13), nonsmoker;  [Carboxyhemoglobin 0.9, serum EPO 6.3.; [id number] mutation with reflex to exon 12 analysis negative. Ultrasound abdomen-  mild hepatic steatosis, no hepatosplenomegaly.] otherwise unremarkable.  Started phlebotomy treatment on 04/29/13. Labs done on 04/29/13 - Hemoglobin 17.4, hematocrit 53%, WBC 9500 with unremarkable differential, platelets 253, iron study normal.  # July 2018- colo  # OSA/not on CPAP sec to mask     No history exists.    This is my first interaction with the patient as patient's primary oncologist has been [facility]. I reviewed the patient's prior charts/pertinent labs/imaging in detail; findings are summarized above.     INTERVAL HISTORY:  Jorge Dilauro Tetzloff Sr. 66 y.o.  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  Patient has not had any phlebotomies the last many months. Denies any headaches. Denies any shortness of breath or cough. Denies any fatigue.  Patient has been diagnosed with obstructive sleep apnea; is not using because of difficulty with the mask. He has been trying to lose weight. He is awaiting a colonoscopy in 2 weeks from now.  REVIEW OF SYSTEMS:  A complete 10 point review of system is done which is negative except mentioned above/history of present illness.   PAST MEDICAL HISTORY :  Past Medical History:  Diagnosis Date  . BPH (benign prostatic hyperplasia)   . Dermatitis herpetiformis   . Diverticulosis   . Erectile dysfunction   . Erythrocytosis   . Esophagitis   . Gastritis   . Gout   . Hyperglycemia   . Hyperlipidemia   . Inappropriate sinus node tachycardia   . Internal  hemorrhoids   . Osteoarthritis   . Polycythemia   . Prediabetes   . Right rotator cuff tear   . Sleep apnea     PAST SURGICAL HISTORY :   Past Surgical History:  Procedure Laterality Date  . COLONOSCOPY WITH ESOPHAGOGASTRODUODENOSCOPY (EGD)  03/28/2007    FAMILY HISTORY :  No family history on file.  SOCIAL HISTORY:   Social History  Substance Use Topics  . Smoking status: Never Smoker  . Smokeless tobacco: Never Used  . Alcohol use Yes     Comment: Drinks 6 or 8 beers on the weekend    ALLERGIES:  has No Known Allergies.  MEDICATIONS:  Current Outpatient Prescriptions  Medication Sig Dispense Refill  . acetaminophen (TYLENOL) 500 MG tablet Take by mouth.    Marland Kitchen allopurinol (ZYLOPRIM) 300 MG tablet     . aspirin EC 81 MG tablet Take 81 mg by mouth daily.    . carvedilol (COREG) 3.125 MG tablet Take 3.125 mg by mouth 2 (two) times daily.    . clotrimazole-betamethasone (LOTRISONE) cream APPLY TOPICALLY 2 (TWO) TIMES DAILY.  3  . finasteride (PROSCAR) 5 MG tablet     . omeprazole (PRILOSEC) 20 MG capsule     . polyethylene glycol powder (GLYCOLAX/MIRALAX) powder TAKE 255 G BY MOUTH ONCE DAILY FOR 1 DAY. TAKE AS DIRECTED FOR COLONOSCOPY.  0  . silodosin (RAPAFLO) 8 MG CAPS capsule Take 8 mg by mouth daily.    Marland Kitchen ULTRAVATE 0.05 % LOTN APPLY TO ITCHY RASH ON BODY ONE TO TWO TIMES DAILY UNTIL CLEAR  AND THEN A SNEEDED FOR FLARES  1   No current facility-administered medications for this visit.     PHYSICAL EXAMINATION: ECOG PERFORMANCE STATUS: 0 - Asymptomatic  BP 132/85 (BP Location: Left Arm, Patient Position: Sitting)   Pulse 72   Temp 97.9 F (36.6 C) (Tympanic)   Resp 16   Wt 204 lb 8 oz (92.8 kg)   BMI 34.03 kg/m   Filed Weights   01/18/17 1027  Weight: 204 lb 8 oz (92.8 kg)    GENERAL: Well-nourished well-developed; Alert, no distress and comfortable.   Alone.  EYES: no pallor or icterus OROPHARYNX: no thrush or ulceration; good dentition  NECK: supple, no  masses felt LYMPH:  no palpable lymphadenopathy in the cervical, axillary or inguinal regions LUNGS: clear to auscultation and  No wheeze or crackles HEART/CVS: regular rate & rhythm and no murmurs; No lower extremity edema ABDOMEN:abdomen soft, non-tender and normal bowel sounds Musculoskeletal:no cyanosis of digits and no clubbing  PSYCH: alert & oriented x 3 with fluent speech NEURO: no focal motor/sensory deficits SKIN:  no rashes or significant lesions  LABORATORY DATA:  I have reviewed the data as listed No results found for: NA, K, CL, CO2, GLUCOSE, BUN, CREATININE, CALCIUM, PROT, ALBUMIN, AST, ALT, ALKPHOS, BILITOT, GFRNONAA, GFRAA  No results found for: SPEP, UPEP  Lab Results  Component Value Date   WBC 6.9 01/18/2017   NEUTROABS 4.0 01/18/2017   HGB 15.9 01/18/2017   HCT 47.3 01/18/2017   MCV 82.0 01/18/2017   PLT 278 01/18/2017      Chemistry   No results found for: NA, K, CL, CO2, BUN, CREATININE, GLU No results found for: CALCIUM, ALKPHOS, AST, ALT, BILITOT     RADIOGRAPHIC STUDIES: I have personally reviewed the radiological images as listed and agreed with the findings in the report. No results found.   ASSESSMENT & PLAN:  Erythrocytosis # Erythocytosis-likely secondary; I seriously doubt if patient has any myeloproliferative neoplasm; as patient has not needed any phlebotomies for the last many months. Question secondary to obstructive sleep apnea. Today hematocrit is 47; patient does not feel any different pre or post-phlebotomy. Not recommend phlebotomy at this time  # Obesity/OSA- recommend continued weight loss and follow up with PCP regarding CPAP mask.  # H&H/possible phlebotomy in 6 months; MD/ 12 months/labs/ phlebotomy.   Cc; Dr.Sparks    Orders Placed This Encounter  Procedures  . Hematocrit ([facility])    Standing Status:   Future    Standing Expiration Date:   01/18/2018  . Hemoglobin [facility])    Standing Status:   Future    Standing  Expiration Date:   01/18/2018   All questions were answered. The patient knows to call the clinic with any problems, questions or concerns.      Cammie Sickle, MD 01/18/2017 4:26 PM

## 2017-01-18 NOTE — Progress Notes (Signed)
Patient here today for follow up.  Patient states no new concerns today  

## 2017-01-18 NOTE — Assessment & Plan Note (Addendum)
#   Erythocytosis-likely secondary; I seriously doubt if patient has any myeloproliferative neoplasm; as patient has not needed any phlebotomies for the last many months. Question secondary to obstructive sleep apnea. Today hematocrit is 47; patient does not feel any different pre or post-phlebotomy. Not recommend phlebotomy at this time  # Obesity/OSA- recommend continued weight loss and follow up with PCP regarding CPAP mask.  # H&H/possible phlebotomy in 6 months; MD/ 12 months/labs/ phlebotomy.   Cc; Dr.Sparks

## 2017-01-18 NOTE — Telephone Encounter (Signed)
Please finish the note- like past medical/surgical histoies etc. Thx

## 2017-02-18 DIAGNOSIS — I429 Cardiomyopathy, unspecified: Secondary | ICD-10-CM | POA: Insufficient documentation

## 2017-02-18 DIAGNOSIS — I428 Other cardiomyopathies: Secondary | ICD-10-CM | POA: Insufficient documentation

## 2017-07-19 ENCOUNTER — Inpatient Hospital Stay: Payer: 59

## 2017-07-19 ENCOUNTER — Inpatient Hospital Stay (HOSPITAL_BASED_OUTPATIENT_CLINIC_OR_DEPARTMENT_OTHER): Payer: 59 | Admitting: Nurse Practitioner

## 2017-07-19 ENCOUNTER — Encounter: Payer: Self-pay | Admitting: Nurse Practitioner

## 2017-07-19 ENCOUNTER — Inpatient Hospital Stay: Payer: 59 | Attending: Nurse Practitioner

## 2017-07-19 VITALS — BP 136/88 | HR 80 | Resp 18

## 2017-07-19 DIAGNOSIS — M1990 Unspecified osteoarthritis, unspecified site: Secondary | ICD-10-CM

## 2017-07-19 DIAGNOSIS — E669 Obesity, unspecified: Secondary | ICD-10-CM

## 2017-07-19 DIAGNOSIS — M109 Gout, unspecified: Secondary | ICD-10-CM | POA: Insufficient documentation

## 2017-07-19 DIAGNOSIS — D751 Secondary polycythemia: Secondary | ICD-10-CM | POA: Diagnosis present

## 2017-07-19 DIAGNOSIS — K649 Unspecified hemorrhoids: Secondary | ICD-10-CM

## 2017-07-19 DIAGNOSIS — Z7982 Long term (current) use of aspirin: Secondary | ICD-10-CM | POA: Diagnosis not present

## 2017-07-19 DIAGNOSIS — R000 Tachycardia, unspecified: Secondary | ICD-10-CM | POA: Diagnosis not present

## 2017-07-19 DIAGNOSIS — G4730 Sleep apnea, unspecified: Secondary | ICD-10-CM | POA: Diagnosis not present

## 2017-07-19 DIAGNOSIS — N400 Enlarged prostate without lower urinary tract symptoms: Secondary | ICD-10-CM

## 2017-07-19 DIAGNOSIS — E785 Hyperlipidemia, unspecified: Secondary | ICD-10-CM | POA: Diagnosis not present

## 2017-07-19 DIAGNOSIS — Z79899 Other long term (current) drug therapy: Secondary | ICD-10-CM | POA: Insufficient documentation

## 2017-07-19 DIAGNOSIS — Z8719 Personal history of other diseases of the digestive system: Secondary | ICD-10-CM | POA: Insufficient documentation

## 2017-07-19 LAB — HEMOGLOBIN: HEMOGLOBIN: 17 g/dL (ref 13.0–18.0)

## 2017-07-19 LAB — HEMATOCRIT: HCT: 51.1 % (ref 40.0–52.0)

## 2017-07-19 NOTE — Progress Notes (Signed)
[place] OFFICE PROGRESS NOTE  Patient Care Team: Idelle Crouch, MD as PCP - General (Internal Medicine)  Cancer Staging No matching staging information was found for the patient.  # ERYTHROCYTOSIS: Erythrocytosis  (hemoglobin 17.9 g/dL, hematocrit 52.3% on CBC done 04/17/13), nonsmoker;  [Carboxyhemoglobin 0.9, serum EPO 6.3.; [id number] mutation with reflex to exon 12 analysis negative. Ultrasound abdomen-  mild hepatic steatosis, no hepatosplenomegaly.] otherwise unremarkable.  Started phlebotomy treatment on 04/29/13. Labs done on 04/29/13 - Hemoglobin 17.4, hematocrit 53%, WBC 9500 with unremarkable differential, platelets 253, iron study normal.  # July 2018- colo  # OSA/not on CPAP sec to mask     No history exists.    INTERVAL HISTORY:  Jorge Moroney Kruszka Sr. 66 y.o.  male pleasant patient above history of isolated erythrocytosis, at least since 2014, is here for follow-up.  Patient reports last phlebotomy was approximately one year ago. States he tolerates phlebotomy 'fine'.   Denies headaches or sob. Denies cough. Fatigue is seasonal and unchanged. Has gained some weight but says he loses it during the summer months when he is more active. Hx of OSA but had complications w/ mask d/t claustrophobia. Colonoscopy was earlier this year with planned f/u in 5 years.    REVIEW OF SYSTEMS:  A complete 10 point review of system is done which is negative except mentioned above/history of present illness.   PAST MEDICAL HISTORY :  Past Medical History:  Diagnosis Date  . BPH (benign prostatic hyperplasia)   . Dermatitis herpetiformis   . Diverticulosis   . Erectile dysfunction   . Erythrocytosis   . Esophagitis   . Gastritis   . Gout   . Hyperglycemia   . Hyperlipidemia   . Inappropriate sinus node tachycardia   . Internal hemorrhoids   . Osteoarthritis   . Polycythemia   . Prediabetes   . Right rotator cuff tear   . Sleep apnea     PAST SURGICAL HISTORY  :   Past Surgical History:  Procedure Laterality Date  . COLONOSCOPY WITH ESOPHAGOGASTRODUODENOSCOPY (EGD)  03/28/2007    FAMILY HISTORY :  History reviewed. No pertinent family history.  SOCIAL HISTORY:   Social History   Tobacco Use  . Smoking status: Never Smoker  . Smokeless tobacco: Never Used  Substance Use Topics  . Alcohol use: Yes    Comment: Drinks 6 or 8 beers on the weekend  . Drug use: No    ALLERGIES:  has No Known Allergies.  MEDICATIONS:  Current Outpatient Medications  Medication Sig Dispense Refill  . acetaminophen (TYLENOL) 500 MG tablet Take by mouth.    Marland Kitchen allopurinol (ZYLOPRIM) 300 MG tablet     . aspirin EC 81 MG tablet Take 81 mg by mouth daily.    . carvedilol (COREG) 3.125 MG tablet Take 3.125 mg by mouth 2 (two) times daily.    . mirabegron ER (MYRBETRIQ) 50 MG TB24 tablet Take by mouth.    Marland Kitchen omeprazole (PRILOSEC) 20 MG capsule     . polyethylene glycol powder (GLYCOLAX/MIRALAX) powder TAKE 255 G BY MOUTH ONCE DAILY FOR 1 DAY. TAKE AS DIRECTED FOR COLONOSCOPY.  0  . clotrimazole-betamethasone (LOTRISONE) cream APPLY TOPICALLY 2 (TWO) TIMES DAILY.  3  . dapsone 25 MG tablet Take 25 mg by mouth daily.  0   No current facility-administered medications for this visit.     PHYSICAL EXAMINATION: ECOG PERFORMANCE STATUS: 0 - Asymptomatic  BP (!) 148/94 (BP Location: Left Arm, Patient  Position: Sitting)   Pulse 79   Temp 97.7 F (36.5 C) (Tympanic)   Resp 16   Wt 210 lb 9.6 oz (95.5 kg)   BMI 35.05 kg/m   Filed Weights   07/19/17 1409  Weight: 210 lb 9.6 oz (95.5 kg)    GENERAL: Well-nourished well-developed; Alert, no distress and comfortable. Alone.  EYES: no pallor or icterus OROPHARYNX: no thrush or ulceration; good dentition  NECK: supple, no masses felt LYMPH: no palpable lymphadenopathy in the cervical, axillary or inguinal regions LUNGS: clear to auscultation and  No wheeze or crackles HEART/CVS: regular rate & rhythm and no  murmurs; No lower extremity edema ABDOMEN: abdomen soft, non-tender and normal bowel sounds Musculoskeletal: no cyanosis of digits and no clubbing  PSYCH: alert & oriented x 3 with fluent speech NEURO: no focal motor/sensory deficits SKIN: no rashes or significant lesions  LABORATORY DATA:  I have reviewed the data as listed No results found for: NA, K, CL, CO2, GLUCOSE, BUN, CREATININE, CALCIUM, PROT, ALBUMIN, AST, ALT, ALKPHOS, BILITOT, GFRNONAA, GFRAA  No results found for: SPEP, UPEP  Lab Results  Component Value Date   WBC 6.9 01/18/2017   NEUTROABS 4.0 01/18/2017   HGB 17.0 07/19/2017   HCT 51.1 07/19/2017   MCV 82.0 01/18/2017   PLT 278 01/18/2017      Chemistry   No results found for: NA, K, CL, CO2, BUN, CREATININE, GLU No results found for: CALCIUM, ALKPHOS, AST, ALT, BILITOT     RADIOGRAPHIC STUDIES: I have personally reviewed the radiological images as listed and agreed with the findings in the report. No results found.   ASSESSMENT & PLAN:  Erythrocytosis # Erythocytosis- clinically, pt feels well and has tolerated previous phlebotomies well. Last one approximately 1 year ago. Myeloproliferative neoplasm unlikely d/t extended duration of time b/t phlebotomies and stable nature of blood counts. suspect secondary possibly r/t OSA (intol CPAP mask). HCT 51.1 today. Recommend phlebotomy today.  # Obesity/OSA- Discussed recent weight gain (6 lb in 6 mo). Encouraged weight loss 1-2 lbs/week and daily sustained activity. Please follow up with PCP.    # Phlebotomy today. 6 mo- cbc,cmp/MD/phlebotomy  Cc; Dr.Sparks    No orders of the defined types were placed in this encounter.  All questions were answered. The patient knows to call the clinic with any problems, questions or concerns.     Beckey Rutter, DNP, AGNP-C [place] [facility] [phone] (office) 07/19/17 3:12 PM

## 2017-07-19 NOTE — Assessment & Plan Note (Signed)
#   Erythocytosis- clinically, pt feels well and has tolerated previous phlebotomies well. Last one approximately 1 year ago. Myeloproliferative neoplasm unlikely d/t extended duration of time b/t phlebotomies and stable nature of blood counts. suspect secondary possibly r/t OSA (intol CPAP mask). HCT 51.1 today. Recommend phlebotomy today.  # Obesity/OSA- Discussed recent weight gain (6 lb in 6 mo). Encouraged weight loss 1-2 lbs/week and daily sustained activity. Please follow up with PCP.    # Phlebotomy today. 6 mo- cbc,cmp/MD/phlebotomy  Cc; Dr.Sparks

## 2017-08-09 ENCOUNTER — Encounter: Payer: Self-pay | Admitting: Dietician

## 2017-08-09 ENCOUNTER — Encounter: Payer: BLUE CROSS/BLUE SHIELD | Attending: Dermatology | Admitting: Dietician

## 2017-08-09 VITALS — Ht 65.0 in | Wt 209.8 lb

## 2017-08-09 DIAGNOSIS — Z713 Dietary counseling and surveillance: Secondary | ICD-10-CM | POA: Insufficient documentation

## 2017-08-09 DIAGNOSIS — L130 Dermatitis herpetiformis: Secondary | ICD-10-CM | POA: Insufficient documentation

## 2017-08-09 DIAGNOSIS — Z6834 Body mass index (BMI) 34.0-34.9, adult: Secondary | ICD-10-CM | POA: Diagnosis not present

## 2017-08-09 NOTE — Patient Instructions (Signed)
   Pinner test:  tests for food intolerances and allergies if you would ever like to consider this  Ask for the gluten free menu at restaurants, or let you waiter/waitress know that you're gluten free. State that it's an allergy if asked  Follow the gluten free diet for around 3 months consistently to yield best results

## 2017-08-09 NOTE — Progress Notes (Signed)
Medical Nutrition Therapy: Visit start time: 0845  end time: 0915  Assessment:  Diagnosis: dermatitis Past medical history: HLD, GI reflux, see chart Psychosocial issues/ stress concerns: none Preferred learning method:  . Auditory  Current weight: 209.8lb  Height: 5\' 5"  Medications, supplements: dapsone, aspirin, carredilol, omeprazole, benefiber, see chart for full list  Progress and evaluation: Patient has been instructed to follow a gluten free diet in an attempt to alleviate his skin rash. He has been taking Dapsone off and on which relieves his rash but would prefer not to take it. Per patient, one MD he went to dx him with celiac disease, but another MD suspected a gluten allergy rather than an autoimmune disease. He has been following the diet "off and on" for a couple of months and has not noticed any differences. He has tried gluten free pastas, chips, and breads. Does not like GF bread and so has just been cutting down on bread consumption as a whole. He is hesitant to change his eating habits at this time.   Physical activity: walks or bikes 5 days/week for 15-53min each session  Nutrition Care Education: Topics covered: parameters of the gluten free diet, antioxidants and their role in reducing inflammation, refined carbohydrates and inflammation, reading nutrition facts labels and ingredients lists to identify sources of gluten, alternative food allergy / intolerance tests, eating out gluten free Basic nutrition: basic food groups, appropriate nutrient balance, general nutrition guidelines    Other lifestyle changes: benefits of making changes, increasing motivation, readiness for change, alcohol use and gluten free diet  Nutritional Diagnosis:  NI-5.8.4 Inconsistent carbohydrate intake As related to gluten free diet.  As evidenced by patient report of following the gluten free diet "off and on" despite instruction to adhere to diet 100%.  Intervention: Discussion as noted above.  Patient plans to adhere to the gluten free diet as instructed for approx. 3 months, at which time he will reassess and, based on his symptoms or lack of symptoms, decide whether to permanently follow the diet or not.  Education Materials given:  . Adding antioxidants to your diet handout . Celiac Disease nutrition therapy . Goals/ instructions  Learner/ who was taught:  . Patient  Level of understanding: . Unable to understand/ needs instruction . Partial understanding; needs review/ practice . Verbalizes/ demonstrates competency . Not applicable Demonstrated degree of understanding via:   Teach back Learning barriers: Marland Kitchen Motivation lacking  Willingness to learn/ readiness for change: . Acceptance, ready for change  Monitoring and Evaluation:  Dietary intake and adherence to gluten free diet, body weight      follow up: prn

## 2018-01-07 ENCOUNTER — Ambulatory Visit: Payer: BLUE CROSS/BLUE SHIELD | Admitting: Gastroenterology

## 2018-01-17 ENCOUNTER — Ambulatory Visit: Payer: 59 | Admitting: Internal Medicine

## 2018-01-17 ENCOUNTER — Other Ambulatory Visit: Payer: 59

## 2018-01-20 ENCOUNTER — Encounter: Payer: Self-pay | Admitting: Gastroenterology

## 2018-01-20 ENCOUNTER — Ambulatory Visit (INDEPENDENT_AMBULATORY_CARE_PROVIDER_SITE_OTHER): Payer: BLUE CROSS/BLUE SHIELD | Admitting: Gastroenterology

## 2018-01-20 VITALS — BP 126/80 | HR 60 | Ht 65.0 in | Wt 202.5 lb

## 2018-01-20 DIAGNOSIS — R1013 Epigastric pain: Secondary | ICD-10-CM | POA: Diagnosis not present

## 2018-01-20 DIAGNOSIS — R030 Elevated blood-pressure reading, without diagnosis of hypertension: Secondary | ICD-10-CM | POA: Insufficient documentation

## 2018-01-20 DIAGNOSIS — R739 Hyperglycemia, unspecified: Secondary | ICD-10-CM | POA: Insufficient documentation

## 2018-01-20 DIAGNOSIS — R7303 Prediabetes: Secondary | ICD-10-CM | POA: Insufficient documentation

## 2018-01-20 DIAGNOSIS — Z9109 Other allergy status, other than to drugs and biological substances: Secondary | ICD-10-CM | POA: Insufficient documentation

## 2018-01-20 MED ORDER — DICYCLOMINE HCL 20 MG PO TABS
20.0000 mg | ORAL_TABLET | Freq: Three times a day (TID) | ORAL | 2 refills | Status: DC
Start: 2018-01-20 — End: 2019-04-07

## 2018-01-20 NOTE — Patient Instructions (Addendum)
You are scheduled for a small bowel follow through at [facility] on Tuesday, August 6th at 9:00am. Please arrive at the medical mall registration desk at 8:45am. You cannot have anything to eat or drink after midnight on Monday night.   If you need to reschedule this appointment for any reason, please contact central scheduling at [phone].

## 2018-01-20 NOTE — Progress Notes (Signed)
Gastroenterology Consultation  Referring Provider:     Idelle Crouch, MD Primary Care Physician:  Idelle Crouch, MD Primary Gastroenterologist:  Dr. Allen Norris     Reason for Consultation:     Abdominal discomfort        HPI:   Jorge Mose Saltsman Sr. is a 67 y.o. y/o male referred for consultation & management of abdominal discomfort by Dr. Doy Hutching, Leonie Douglas, MD.  This patient comes in today after being seen in the past by Dr. Vira Agar and his nurse practitioner.  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  I do not have access to his pathology but it is reported that the pathology did not show celiac sprue but he did have a colonoscopy showing an adenomatous polyp in 2018 and a recommendation for a repeat colonoscopy in 5 years.  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  The patient had reported that he has never been on a true gluten-free diet.  The patient also had a anti-endomysial antibody was negative but had a mildly elevated TTG.  The patient is being sent for a second opinion from dermatology to see if he actually has dermatitis herpetiformis and is now seeing me for second opinion of possible celiac sprue.  The patient comes today with a report of abdominal discomfort for approximately 3 years that he reports being worse this year.  He is presently taking dapsone for his dermatitis the piriformis and states that every time he stops taking the medication he has a recurrence of his symptoms.  The patient did try a gluten-free diet for 6 months and reports that he did not feel any different.  There is no report of any unexplained weight loss fevers chills nausea vomiting.  The patient states he has attacks of abdominal pain that feel like a very hard spot in the epigastric area followed by cramping in the lower abdomen and the feel of urgency with running to the bathroom with  very little results.  The patient is also concerned that he may have irritable bowel syndrome since stress makes his symptoms worse.  He also reports that prior to all the discomfort and running to the bathroom he has increased bowel sounds.  He states that this will happen while he is eating or even if he drinks some water.  He reports that since the gluten-free diet did not help and he has gone back to eating gluten recently.  Past Medical History:  Diagnosis Date  . BPH (benign prostatic hyperplasia)   . Dermatitis herpetiformis   . Diverticulosis   . Erectile dysfunction   . Erythrocytosis   . Esophagitis   . Gastritis   . Gout   . Hyperglycemia   . Hyperlipidemia   . Inappropriate sinus node tachycardia   . Internal hemorrhoids   . Osteoarthritis   . Polycythemia   . Prediabetes   . Right rotator cuff tear   . Sleep apnea     Past Surgical History:  Procedure Laterality Date  . COLONOSCOPY WITH ESOPHAGOGASTRODUODENOSCOPY (EGD)  03/28/2007    Prior to Admission medications   Medication Sig Start Date End Date Taking? Authorizing Provider  acetaminophen (TYLENOL) 500 MG tablet Take by mouth.    [provider]  allopurinol (ZYLOPRIM) 300 MG tablet  01/18/15   [provider]  aspirin EC 81 MG tablet Take 81  mg by mouth daily.    [provider]  carvedilol (COREG) 3.125 MG tablet Take 3.125 mg by mouth 2 (two) times daily. 06/28/16 07/19/17  [provider]  clotrimazole-betamethasone (LOTRISONE) cream APPLY TOPICALLY 2 (TWO) TIMES DAILY. 12/18/16   [provider]  dapsone 25 MG tablet Take 25 mg by mouth daily. 07/15/17   [provider]  mirabegron ER (MYRBETRIQ) 50 MG TB24 tablet Take by mouth. 05/27/17   [provider]  omeprazole (PRILOSEC) 20 MG capsule  05/31/15   [provider]  polyethylene glycol powder (GLYCOLAX/MIRALAX) powder TAKE 255 G BY MOUTH ONCE DAILY FOR 1 DAY. TAKE AS DIRECTED FOR  COLONOSCOPY. 12/04/16   [provider]    No family history on file.   Social History   Tobacco Use  . Smoking status: Never Smoker  . Smokeless tobacco: Never Used  Substance Use Topics  . Alcohol use: Yes    Comment: Drinks 6 or 8 beers on the weekend  . Drug use: No    Allergies as of 01/20/2018  . (No Known Allergies)    Review of Systems:    All systems reviewed and negative except where noted in HPI.   Physical Exam:  There were no vitals taken for this visit. No LMP for male patient. General:   Alert,  Well-developed, well-nourished, pleasant and cooperative in NAD Head:  Normocephalic and atraumatic. Eyes:  Sclera clear, no icterus.   Conjunctiva pink. Ears:  Normal auditory acuity. Nose:  No deformity, discharge, or lesions. Mouth:  No deformity or lesions,oropharynx pink & moist. Neck:  Supple; no masses or thyromegaly. Lungs:  Respirations even and unlabored.  Clear throughout to auscultation.   No wheezes, crackles, or rhonchi. No acute distress. Heart:  Regular rate and rhythm; no murmurs, clicks, rubs, or gallops. Abdomen:  Normal bowel sounds.  No bruits.  Soft, non-tender and non-distended without masses, hepatosplenomegaly or hernias noted.  No guarding or rebound tenderness.  Negative Carnett sign.   Rectal:  Deferred.  Msk:  Symmetrical without gross deformities.  Good, equal movement & strength bilaterally. Pulses:  Normal pulses noted. Extremities:  No clubbing or edema.  No cyanosis. Neurologic:  Alert and oriented x3;  grossly normal neurologically. Skin:  Intact without significant lesions or rashes.  No jaundice. Lymph Nodes:  No significant cervical adenopathy. Psych:  Alert and cooperative. Normal mood and affect.  Imaging Studies: No results found.  Assessment and Plan:   Jorge Choi Dimperio Sr. is a 67 y.o. y/o male who comes in with abdominal discomfort for the last few years that he states has gotten worse in the last year.  The  patient reports that he has a hard area in his epigastric area that feels like a cramp and then it goes down to his lower abdomen which results and crampy abdominal pain and resulting multiple trips to the bathroom.  He states he does not have results with every trip to the bathroom but feels like he has to go the bathroom.  He also states that when he is under a lot of stress his symptoms are worse and he thinks he may have irritable bowel syndrome.  I agree with him that irritable bowel syndrome is likely a diagnosis here.  I am unsure whether the patient actually has celiac sprue with a history of negative biopsies although I do not have those pathology results. The patient will be started on dicyclomine 20 mg 3 times a day when he  starts having the symptoms.  He states that he can go a couple weeks without having any abdominal pain before he gets these attacks at last 10 days. The patient will also be set up for a blood test for DQ 2 and DQ 8 to see if he is susceptible to having celiac sprue.  He will also have a small bowel follow-through sent off due to the risk of lymphoma with patients who do have celiac sprue.  The patient's last sprue panel was only positive for an intermediate TTG and he will have this test repeated again. The patient has been explained the plan and agrees with it.  Lucilla Lame, MD. Marval Regal    Note: This dictation was prepared with Dragon dictation along with smaller phrase technology. Any transcriptional errors that result from this process are unintentional.

## 2018-01-21 ENCOUNTER — Telehealth: Payer: Self-pay | Admitting: Gastroenterology

## 2018-01-21 NOTE — Telephone Encounter (Signed)
Patient who was seen yesterday by Dr.Wohl has questions regarding when to take medication dicyclomine and would like a call back to discuss.

## 2018-01-21 NOTE — Telephone Encounter (Signed)
Contacted pt and advised him per Dr. Allen Norris to start taking the dicyclomine when he starts having abdominal pain.

## 2018-01-28 LAB — GLIA (IGA/G) + TTG IGA
Antigliadin Abs, IgA: 15 units (ref 0–19)
GLIADIN IGG: 17 U (ref 0–19)

## 2018-01-28 LAB — CELIAC DISEASE HLA DQ ASSOC.
DQ2 (DQA1 0501/0505,DQB1 02XX): POSITIVE
DQ8 (DQA1 03XX, DQB1 0302): NEGATIVE

## 2018-01-31 ENCOUNTER — Telehealth: Payer: Self-pay

## 2018-01-31 NOTE — Telephone Encounter (Signed)
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

## 2018-01-31 NOTE — Telephone Encounter (Signed)
Pt notified of lab results

## 2018-02-24 ENCOUNTER — Other Ambulatory Visit: Payer: Self-pay | Admitting: Internal Medicine

## 2018-02-24 ENCOUNTER — Other Ambulatory Visit: Payer: Self-pay | Admitting: *Deleted

## 2018-02-24 DIAGNOSIS — D751 Secondary polycythemia: Secondary | ICD-10-CM

## 2018-02-25 ENCOUNTER — Ambulatory Visit
Admission: RE | Admit: 2018-02-25 | Discharge: 2018-02-25 | Disposition: A | Discharge status: Home / Self Care | Payer: Medicare Other | Source: Ambulatory Visit | Attending: Gastroenterology | Admitting: Gastroenterology

## 2018-02-25 DIAGNOSIS — R1013 Epigastric pain: Secondary | ICD-10-CM | POA: Diagnosis not present

## 2018-02-26 ENCOUNTER — Other Ambulatory Visit: Payer: Self-pay

## 2018-02-26 ENCOUNTER — Inpatient Hospital Stay: Payer: Medicare Other | Attending: Internal Medicine

## 2018-02-26 ENCOUNTER — Encounter: Payer: Self-pay | Admitting: Internal Medicine

## 2018-02-26 ENCOUNTER — Inpatient Hospital Stay: Payer: Medicare Other

## 2018-02-26 ENCOUNTER — Inpatient Hospital Stay (HOSPITAL_BASED_OUTPATIENT_CLINIC_OR_DEPARTMENT_OTHER): Payer: Medicare Other | Admitting: Internal Medicine

## 2018-02-26 DIAGNOSIS — D751 Secondary polycythemia: Secondary | ICD-10-CM | POA: Insufficient documentation

## 2018-02-26 LAB — HEMOGLOBIN: Hemoglobin: 17.1 g/dL (ref 13.0–18.0)

## 2018-02-26 LAB — HEMATOCRIT: HCT: 51 % (ref 40.0–52.0)

## 2018-02-26 NOTE — Assessment & Plan Note (Addendum)
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 Today hematocrit is 51.  Hold phlebotomy.  #Follow-up in 12 months possible phlebotomy.  Cc; Dr.Sparks

## 2018-02-26 NOTE — Progress Notes (Signed)
No phlebotomy for patient today per Dr. Rogue Bussing

## 2018-02-26 NOTE — Progress Notes (Signed)
[place] OFFICE PROGRESS NOTE  Patient Care Team: Idelle Crouch, MD as PCP - General (Internal Medicine)  Cancer Staging No matching staging information was found for the patient.  # ERYTHROCYTOSIS: Erythrocytosis  (hemoglobin 17.9 g/dL, hematocrit 52.3% on CBC done 04/17/13), nonsmoker;  [Carboxyhemoglobin 0.9, serum EPO 6.3.; [id number] mutation with reflex to exon 12 analysis negative. Ultrasound abdomen-  mild hepatic steatosis, no hepatosplenomegaly.] otherwise unremarkable.  Started phlebotomy treatment on 04/29/13. Labs done on 04/29/13 - Hemoglobin 17.4, hematocrit 53%, WBC 9500 with unremarkable differential, platelets 253, iron study normal.  # July 2018- colo  # OSA/not on CPAP sec to mask     No history exists.    INTERVAL HISTORY:  Jorge Diaz Sr. 67 y.o.  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  Patient denies any headaches.  Denies any recent strokes or thromboembolic events.  He had a recent colonoscopy.  REVIEW OF SYSTEMS:  A complete 10 point review of system is done which is negative except mentioned above/history of present illness.   PAST MEDICAL HISTORY :  Past Medical History:  Diagnosis Date  . BPH (benign prostatic hyperplasia)   . Dermatitis herpetiformis   . Diverticulosis   . Erectile dysfunction   . Erythrocytosis   . Esophagitis   . Gastritis   . Gout   . Hyperglycemia   . Hyperlipidemia   . Inappropriate sinus node tachycardia   . Internal hemorrhoids   . Osteoarthritis   . Polycythemia   . Prediabetes   . Right rotator cuff tear   . Sleep apnea     PAST SURGICAL HISTORY :   Past Surgical History:  Procedure Laterality Date  . COLONOSCOPY WITH ESOPHAGOGASTRODUODENOSCOPY (EGD)  03/28/2007    FAMILY HISTORY :  History reviewed. No pertinent family history.  SOCIAL HISTORY:   Social History   Tobacco Use  . Smoking status: Never Smoker  .  Smokeless tobacco: Never Used  Substance Use Topics  . Alcohol use: Yes    Comment: Drinks 6 or 8 beers on the weekend  . Drug use: No    ALLERGIES:  has No Known Allergies.  MEDICATIONS:  Current Outpatient Medications  Medication Sig Dispense Refill  . acetaminophen (TYLENOL) 500 MG tablet Take by mouth.    Marland Kitchen allopurinol (ZYLOPRIM) 300 MG tablet     . aspirin EC 81 MG tablet Take 81 mg by mouth daily.    . carvedilol (COREG) 3.125 MG tablet Take 3.125 mg by mouth 2 (two) times daily.    . dapsone 25 MG tablet Take 25 mg by mouth daily.  0  . dicyclomine (BENTYL) 20 MG tablet Take 1 tablet (20 mg total) by mouth 3 (three) times daily before meals. 90 tablet 2  . omeprazole (PRILOSEC) 20 MG capsule      No current facility-administered medications for this visit.     PHYSICAL EXAMINATION: ECOG PERFORMANCE STATUS: 0 - Asymptomatic  BP (!) 146/93 (BP Location: Left Arm, Patient Position: Sitting)   Pulse 81   Temp 99.1 F (37.3 C) (Tympanic)   Resp 16   Wt 197 lb 9.6 oz (89.6 kg)   BMI 32.88 kg/m   Filed Weights   02/26/18 1352  Weight: 197 lb 9.6 oz (89.6 kg)    GENERAL: Well-nourished well-developed; Alert, no distress and comfortable.   Alone.  EYES: no pallor or icterus OROPHARYNX: no thrush or ulceration; good dentition  NECK:  supple, no masses felt LYMPH:  no palpable lymphadenopathy in the cervical, axillary or inguinal regions LUNGS: clear to auscultation and  No wheeze or crackles HEART/CVS: regular rate & rhythm and no murmurs; No lower extremity edema ABDOMEN:abdomen soft, non-tender and normal bowel sounds Musculoskeletal:no cyanosis of digits and no clubbing  PSYCH: alert & oriented x 3 with fluent speech NEURO: no focal motor/sensory deficits SKIN:  no rashes or significant lesions  LABORATORY DATA:  I have reviewed the data as listed No results found for: NA, K, CL, CO2, GLUCOSE, BUN, CREATININE, CALCIUM, PROT, ALBUMIN, AST, ALT, ALKPHOS, BILITOT,  GFRNONAA, GFRAA  No results found for: SPEP, UPEP  Lab Results  Component Value Date   WBC 6.9 01/18/2017   NEUTROABS 4.0 01/18/2017   HGB 17.1 02/26/2018   HCT 51.0 02/26/2018   MCV 82.0 01/18/2017   PLT 278 01/18/2017      Chemistry   No results found for: NA, K, CL, CO2, BUN, CREATININE, GLU No results found for: CALCIUM, ALKPHOS, AST, ALT, BILITOT     RADIOGRAPHIC STUDIES: I have personally reviewed the radiological images as listed and agreed with the findings in the report. No results found.   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 Today hematocrit is 51.  Hold phlebotomy.  #Follow-up in 12 months possible phlebotomy.  Cc; Dr.Sparks    Orders Placed This Encounter  Procedures  . CBC with Differential/Platelet    Standing Status:   Future    Standing Expiration Date:   02/27/2019   All questions were answered. The patient knows to call the clinic with any problems, questions or concerns.      Cammie Sickle, MD 03/02/2018 8:38 PM

## 2018-03-19 ENCOUNTER — Telehealth: Payer: Self-pay

## 2018-03-19 NOTE — Telephone Encounter (Signed)
Pt notified of small bowel follow through results. Still having stomach discomfort. Follow up appt has been scheduled.

## 2018-03-19 NOTE — Telephone Encounter (Signed)
-----   Message from Lucilla Lame, MD sent at 03/17/2018  5:18 PM EDT ----- Let the patient know that the small bowel follow through did not show any signs of lymphoma or obstruction.

## 2018-03-31 ENCOUNTER — Encounter: Payer: Self-pay | Admitting: Gastroenterology

## 2018-03-31 ENCOUNTER — Encounter (INDEPENDENT_AMBULATORY_CARE_PROVIDER_SITE_OTHER): Payer: Self-pay

## 2018-03-31 ENCOUNTER — Ambulatory Visit (INDEPENDENT_AMBULATORY_CARE_PROVIDER_SITE_OTHER): Payer: Medicare Other | Admitting: Gastroenterology

## 2018-03-31 VITALS — BP 139/91 | HR 64 | Ht 65.0 in | Wt 200.0 lb

## 2018-03-31 DIAGNOSIS — R1031 Right lower quadrant pain: Secondary | ICD-10-CM

## 2018-03-31 DIAGNOSIS — K589 Irritable bowel syndrome without diarrhea: Secondary | ICD-10-CM

## 2018-03-31 DIAGNOSIS — G8929 Other chronic pain: Secondary | ICD-10-CM

## 2018-03-31 NOTE — Progress Notes (Signed)
Primary Care Physician: Idelle Crouch, MD  Primary Gastroenterologist:  Dr. Lucilla Lame  Chief Complaint  Patient presents with  . Follow up abdominal pain    HPI: Jorge Decou Reap Sr. is a 67 y.o. male here for follow-up after having a small bowel follow-through.  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 The patient has attacks of abdominal pain with some loose bowel movements that happen intermittently.  The patient had blood work sent off that did not show any signs of antibodies consistent with celiac sprue.  The patient also had his Carley Hammed type checked and was only positive for DQ 2 and negative for DQ 8.  The patient denies any unexplained weight loss or chronic diarrhea.  The patient is not on a gluten-free diet and states that he eats sandwiches during the weekends.  The patient also had an upper endoscopy by Dr. Vira Agar with reported biopsies of the small bowel that did not show any sign of celiac sprue.  The patient has been taking dapsone for when he gets the rash on his elbows and states that usually clears up in 1-2 days.  Current Outpatient Medications  Medication Sig Dispense Refill  . acetaminophen (TYLENOL) 500 MG tablet Take by mouth.    Jorge Diaz allopurinol (ZYLOPRIM) 300 MG tablet     . aspirin EC 81 MG tablet Take 81 mg by mouth daily.    . dapsone 25 MG tablet Take 25 mg by mouth daily.  0  . dicyclomine (BENTYL) 20 MG tablet Take 1 tablet (20 mg total) by mouth 3 (three) times daily before meals. 90 tablet 2  . omeprazole (PRILOSEC) 20 MG capsule     . carvedilol (COREG) 3.125 MG tablet Take 3.125 mg by mouth 2 (two) times daily.     No current facility-administered medications for this visit.     Allergies as of 03/31/2018  . (No Known Allergies)    ROS:  General: Negative for anorexia, weight loss, fever, chills, fatigue, weakness. ENT: Negative for hoarseness, difficulty swallowing , nasal  congestion. CV: Negative for chest pain, angina, palpitations, dyspnea on exertion, peripheral edema.  Respiratory: Negative for dyspnea at rest, dyspnea on exertion, cough, sputum, wheezing.  GI: See history of present illness. GU:  Negative for dysuria, hematuria, urinary incontinence, urinary frequency, nocturnal urination.  Endo: Negative for unusual weight change.    Physical Examination:   BP (!) 139/91   Pulse 64   Ht 5\' 5"  (1.651 m)   Wt 200 lb (90.7 kg)   BMI 33.28 kg/m   General: Well-nourished, well-developed in no acute distress.  Eyes: No icterus. Conjunctivae pink. Mouth: Oropharyngeal mucosa moist and pink , no lesions erythema or exudate. Lungs: Clear to auscultation bilaterally. Non-labored. Heart: Regular rate and rhythm, no murmurs rubs or gallops.  Abdomen: Bowel sounds are normal, nontender, nondistended, no hepatosplenomegaly or masses, no abdominal bruits or hernia , no rebound or guarding.   Extremities: No lower extremity edema. No clubbing or deformities. Neuro: Alert and oriented x 3.  Grossly intact. Skin: Warm and dry, no jaundice.   Psych: Alert and cooperative, normal mood and affect.  Labs:    Imaging Studies: No results found.  Assessment and Plan:   Jorge Livers Crafts Sr. is a 67 y.o. y/o male who has been told that he may have celiac sprue despite having negative biopsies and no sural markers for celiac sprue.  The patient's  symptoms are also not consistent with celiac sprue.  The patient has been told to avoid dairy products and try to keep track of foods he has eaten prior to his attacks of abdominal pain with gas bloating and feeling of incomplete evacuation. The patient has been told that his DQ 2 does mean that he is at risk of having celiac sprue but does not confirm the diagnosis.  He has been offered a repeat EGD with multiple biopsies the small bowel for confirmation while on a gluten diet but he states that he does not want to go through that  and agrees that it is unlikely that he has celiac sprue.  The patient will start the dicyclomine for his lower abdominal cramps when he gets his next attack.  He tried it last time but ink C may have had a reaction to the medication causing him to have increased pain.  The patient has been told to try to get it if he does have a reaction he can be switched to hyoscyamine or Donnatal.  The patient has been explained the plan and agrees with it.    Lucilla Lame, MD. [name]   Note: This dictation was prepared with Dragon dictation along with smaller phrase technology. Any transcriptional errors that result from this process are unintentional.

## 2018-08-04 ENCOUNTER — Ambulatory Visit: Payer: Medicare HMO | Admitting: Gastroenterology

## 2018-08-04 ENCOUNTER — Other Ambulatory Visit: Payer: Self-pay

## 2018-08-04 ENCOUNTER — Encounter (INDEPENDENT_AMBULATORY_CARE_PROVIDER_SITE_OTHER): Payer: Self-pay

## 2018-08-04 ENCOUNTER — Encounter: Payer: Self-pay | Admitting: Gastroenterology

## 2018-08-04 VITALS — BP 124/81 | HR 81 | Ht 65.0 in | Wt 204.0 lb

## 2018-08-04 DIAGNOSIS — R1031 Right lower quadrant pain: Secondary | ICD-10-CM | POA: Diagnosis not present

## 2018-08-04 DIAGNOSIS — G8929 Other chronic pain: Secondary | ICD-10-CM

## 2018-08-04 MED ORDER — DESIPRAMINE HCL 25 MG PO TABS: 25.0000 mg | ORAL_TABLET | Freq: Every day | ORAL | 3 refills | Status: DC

## 2018-08-04 NOTE — Progress Notes (Signed)
Primary Care Physician: Idelle Crouch, MD  Primary Gastroenterologist:  Dr. Lucilla Lame  Chief Complaint  Patient presents with  . Abdominal Pain    HPI: Jorge Rozario Hosek Sr. is a 68 y.o. male here for follow-up of chronic abdominal pain.  The patient has had a workup for celiac sprue and biopsies have been negative.  His most recent celiac sprue panel showed him to have completely negative blood work.  In the past the patient's TTG was elevated but on his last blood test that was normal. The patient had what appeared to be dermatitis herpetiformis diagnosed by dermatology with reported negative biopsies of the skin. The patient has urgency with multiple trips to the bathroom without results every time he goes to the bathroom.  There is also report of the symptoms being worse when he is under stress and anxious.  He was started on dicyclomine at the last office visit. The patient had a normal small bowel follow-through. The patient reports that the dicyclomine did not help him.  Approximately once a month when he has the symptoms he has vomiting that occurs with his abdominal pain.  He reports that the abdominal pain usually goes away after he has his episode of vomiting.  There is no report of any unexplained weight loss black stools or bloody stools.  The patient also has gone back on his regular diet and is not doing any gluten-free diet at the present time.  His abdominal pain is a bloating type pain that he feels that the top of his abdomen gets very hard when he has his symptoms.  After his attack he will be fine for a significant amount of time before he has another attack.  The patient has been in a situation causing a lot of stress with his mother-in-law being ill.  Current Outpatient Medications  Medication Sig Dispense Refill  . acetaminophen (TYLENOL) 500 MG tablet Take by mouth.    Marland Kitchen allopurinol (ZYLOPRIM) 300 MG tablet     . aspirin EC 81 MG tablet Take 81 mg by mouth daily.    .  dapsone 25 MG tablet Take 25 mg by mouth daily.  0  . dicyclomine (BENTYL) 20 MG tablet Take 1 tablet (20 mg total) by mouth 3 (three) times daily before meals. 90 tablet 2  . omeprazole (PRILOSEC) 20 MG capsule     . carvedilol (COREG) 3.125 MG tablet Take 3.125 mg by mouth 2 (two) times daily.     No current facility-administered medications for this visit.     Allergies as of 08/04/2018  . (No Known Allergies)    ROS:  General: Negative for anorexia, weight loss, fever, chills, fatigue, weakness. ENT: Negative for hoarseness, difficulty swallowing , nasal congestion. CV: Negative for chest pain, angina, palpitations, dyspnea on exertion, peripheral edema.  Respiratory: Negative for dyspnea at rest, dyspnea on exertion, cough, sputum, wheezing.  GI: See history of present illness. GU:  Negative for dysuria, hematuria, urinary incontinence, urinary frequency, nocturnal urination.  Endo: Negative for unusual weight change.    Physical Examination:   BP 124/81   Pulse 81   Ht 5\' 5"  (1.651 m)   Wt 204 lb (92.5 kg)   BMI 33.95 kg/m   General: Well-nourished, well-developed in no acute distress.  Eyes: No icterus. Conjunctivae pink. Mouth: Oropharyngeal mucosa moist and pink , no lesions erythema or exudate. Lungs: Clear to auscultation bilaterally. Non-labored. Heart: Regular rate and rhythm, no murmurs rubs or gallops.  Abdomen: Bowel sounds are normal, nontender, nondistended, no hepatosplenomegaly or masses, no abdominal bruits or hernia , no rebound or guarding.   Extremities: No lower extremity edema. No clubbing or deformities. Neuro: Alert and oriented x 3.  Grossly intact. Skin: Warm and dry, no jaundice.   Psych: Alert and cooperative, normal mood and affect.  Labs:    Imaging Studies: No results found.  Assessment and Plan:   Jorge Mrozek Spadafora Sr. is a 68 y.o. y/o male who comes in today with a history of worsening abdominal pain with stressful situations.  He has  urgency after he eats to run to the bathroom without any weight loss.  The patient also feels much better when he moves his bowels.  There is also a report of bloating and abdominal tightness that was not helped with dicyclomine.  The patient likely has irritable bowel syndrome with a negative work-up in the past.  He states that he has had the symptoms since he was a young boy and reports that he was even told that it may be due to his hiatal hernia.  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  The patient was not helped with dicyclomine and will be started on desipramine 25 mg at bedtime to be increased to 50 mg.  He will also have a celiac sprue test ran again to confirm his previous negative results after having 1 previous positive result in the past.  The patient has been explained the plan and agrees with it.    Lucilla Lame, MD. Marval Regal   Note: This dictation was prepared with Dragon dictation along with smaller phrase technology. Any transcriptional errors that result from this process are unintentional.

## 2018-08-05 LAB — GLIA (IGA/G) + TTG IGA
Antigliadin Abs, IgA: 13 units (ref 0–19)
GLIADIN IGG: 21 U — AB (ref 0–19)
Transglutaminase IgA: 3 U/mL (ref 0–3)

## 2018-08-07 ENCOUNTER — Telehealth: Payer: Self-pay

## 2018-08-07 NOTE — Telephone Encounter (Signed)
-----   Message from Lucilla Lame, MD sent at 08/07/2018 12:40 PM EST ----- Let the patient know that his celiac sprue panel was again negative and it is unlikely that he has celiac sprue.

## 2018-08-07 NOTE — Telephone Encounter (Signed)
Pt's wife notified of lab result.

## 2018-08-19 DIAGNOSIS — R69 Illness, unspecified: Secondary | ICD-10-CM | POA: Diagnosis not present

## 2018-10-03 DIAGNOSIS — Z79899 Other long term (current) drug therapy: Secondary | ICD-10-CM | POA: Diagnosis not present

## 2018-10-03 DIAGNOSIS — L298 Other pruritus: Secondary | ICD-10-CM | POA: Diagnosis not present

## 2018-10-03 DIAGNOSIS — L280 Lichen simplex chronicus: Secondary | ICD-10-CM | POA: Diagnosis not present

## 2018-10-03 DIAGNOSIS — L820 Inflamed seborrheic keratosis: Secondary | ICD-10-CM | POA: Diagnosis not present

## 2018-10-03 DIAGNOSIS — L282 Other prurigo: Secondary | ICD-10-CM | POA: Diagnosis not present

## 2018-10-03 DIAGNOSIS — D45 Polycythemia vera: Secondary | ICD-10-CM | POA: Diagnosis not present

## 2018-10-03 DIAGNOSIS — R21 Rash and other nonspecific skin eruption: Secondary | ICD-10-CM | POA: Diagnosis not present

## 2018-11-28 DIAGNOSIS — Z125 Encounter for screening for malignant neoplasm of prostate: Secondary | ICD-10-CM | POA: Diagnosis not present

## 2018-11-28 DIAGNOSIS — I428 Other cardiomyopathies: Secondary | ICD-10-CM | POA: Diagnosis not present

## 2018-11-28 DIAGNOSIS — Z79899 Other long term (current) drug therapy: Secondary | ICD-10-CM | POA: Diagnosis not present

## 2018-11-28 DIAGNOSIS — I10 Essential (primary) hypertension: Secondary | ICD-10-CM | POA: Diagnosis not present

## 2018-11-28 DIAGNOSIS — D751 Secondary polycythemia: Secondary | ICD-10-CM | POA: Diagnosis not present

## 2018-11-28 DIAGNOSIS — R7303 Prediabetes: Secondary | ICD-10-CM | POA: Diagnosis not present

## 2018-11-28 DIAGNOSIS — R739 Hyperglycemia, unspecified: Secondary | ICD-10-CM | POA: Diagnosis not present

## 2018-11-28 DIAGNOSIS — E7800 Pure hypercholesterolemia, unspecified: Secondary | ICD-10-CM | POA: Diagnosis not present

## 2018-12-08 DIAGNOSIS — R399 Unspecified symptoms and signs involving the genitourinary system: Secondary | ICD-10-CM | POA: Diagnosis not present

## 2018-12-08 DIAGNOSIS — I10 Essential (primary) hypertension: Secondary | ICD-10-CM | POA: Diagnosis not present

## 2018-12-08 DIAGNOSIS — R310 Gross hematuria: Secondary | ICD-10-CM | POA: Diagnosis not present

## 2018-12-08 DIAGNOSIS — N3001 Acute cystitis with hematuria: Secondary | ICD-10-CM | POA: Diagnosis not present

## 2018-12-08 DIAGNOSIS — I428 Other cardiomyopathies: Secondary | ICD-10-CM | POA: Diagnosis not present

## 2019-02-24 DIAGNOSIS — R350 Frequency of micturition: Secondary | ICD-10-CM | POA: Diagnosis not present

## 2019-02-24 DIAGNOSIS — R8290 Unspecified abnormal findings in urine: Secondary | ICD-10-CM | POA: Diagnosis not present

## 2019-02-25 DIAGNOSIS — R300 Dysuria: Secondary | ICD-10-CM | POA: Diagnosis not present

## 2019-02-25 DIAGNOSIS — R350 Frequency of micturition: Secondary | ICD-10-CM | POA: Diagnosis not present

## 2019-03-02 ENCOUNTER — Inpatient Hospital Stay: Payer: Medicare HMO | Attending: Internal Medicine

## 2019-03-02 ENCOUNTER — Other Ambulatory Visit: Payer: Self-pay

## 2019-03-02 ENCOUNTER — Inpatient Hospital Stay: Payer: Medicare HMO

## 2019-03-02 ENCOUNTER — Other Ambulatory Visit: Payer: Self-pay | Admitting: *Deleted

## 2019-03-02 ENCOUNTER — Inpatient Hospital Stay (HOSPITAL_BASED_OUTPATIENT_CLINIC_OR_DEPARTMENT_OTHER): Payer: Medicare HMO | Admitting: Internal Medicine

## 2019-03-02 DIAGNOSIS — D751 Secondary polycythemia: Secondary | ICD-10-CM

## 2019-03-02 DIAGNOSIS — R350 Frequency of micturition: Secondary | ICD-10-CM | POA: Diagnosis not present

## 2019-03-02 LAB — CBC WITH DIFFERENTIAL/PLATELET
Abs Immature Granulocytes: 0.03 10*3/uL (ref 0.00–0.07)
Basophils Absolute: 0.1 10*3/uL (ref 0.0–0.1)
Basophils Relative: 2 %
Eosinophils Absolute: 0.2 10*3/uL (ref 0.0–0.5)
Eosinophils Relative: 2 %
HCT: 48.1 % (ref 39.0–52.0)
Hemoglobin: 16.3 g/dL (ref 13.0–17.0)
Immature Granulocytes: 0 %
Lymphocytes Relative: 29 %
Lymphs Abs: 2.3 10*3/uL (ref 0.7–4.0)
MCH: 29.7 pg (ref 26.0–34.0)
MCHC: 33.9 g/dL (ref 30.0–36.0)
MCV: 87.8 fL (ref 80.0–100.0)
Monocytes Absolute: 0.7 10*3/uL (ref 0.1–1.0)
Monocytes Relative: 8 %
Neutro Abs: 4.6 10*3/uL (ref 1.7–7.7)
Neutrophils Relative %: 59 %
Platelets: 236 10*3/uL (ref 150–400)
RBC: 5.48 MIL/uL (ref 4.22–5.81)
RDW: 13.4 % (ref 11.5–15.5)
WBC: 7.8 10*3/uL (ref 4.0–10.5)
nRBC: 0 % (ref 0.0–0.2)

## 2019-03-02 NOTE — Progress Notes (Signed)
[facility] OFFICE PROGRESS NOTE  Patient Care Team: Idelle Crouch, MD as PCP - General (Internal Medicine)  Cancer Staging No matching staging information was found for the patient.  # ERYTHROCYTOSIS: Erythrocytosis  (hemoglobin 17.9 g/dL, hematocrit 52.3% on CBC done 04/17/13), nonsmoker;  [Carboxyhemoglobin 0.9, serum EPO 6.3.; [id number] mutation with reflex to exon 12 analysis negative. Ultrasound abdomen-  mild hepatic steatosis, no hepatosplenomegaly.] otherwise unremarkable.  Started phlebotomy treatment on 04/29/13. Labs done on 04/29/13 - Hemoglobin 17.4, hematocrit 53%, WBC 9500 with unremarkable differential, platelets 253, iron study normal.  # July 2018- colo  # OSA/not on CPAP sec to mask    Oncology History   No history exists.    INTERVAL HISTORY:  Jorge Deziel Hitt Sr. 67 y.o.  male pleasant patient above history of isolated erythrocytosis, at least since 2014, is here for follow-up.  Patient denies any recent phlebotomies.  Denies any strokes.  Denies any nausea vomiting.  Denies any weight loss.  Appetite is good.  Patient does complain of increased frequency of urination; incomplete emptying/poor stream.  He was recently started on Flomax by primary care.   Review of Systems  Constitutional: Negative for chills, diaphoresis, fever, malaise/fatigue and weight loss.  HENT: Negative for nosebleeds and sore throat.   Eyes: Negative for double vision.  Respiratory: Negative for cough, hemoptysis, sputum production, shortness of breath and wheezing.   Cardiovascular: Negative for chest pain, palpitations, orthopnea and leg swelling.  Gastrointestinal: Negative for abdominal pain, blood in stool, constipation, diarrhea, heartburn, melena, nausea and vomiting.  Genitourinary: Positive for frequency and urgency. Negative for dysuria.  Musculoskeletal: Negative for back pain and joint pain.  Skin: Negative.  Negative for itching and rash.  Neurological:  Negative for dizziness, tingling, focal weakness, weakness and headaches.  Endo/Heme/Allergies: Does not bruise/bleed easily.  Psychiatric/Behavioral: Negative for depression. The patient is not nervous/anxious and does not have insomnia.      PAST MEDICAL HISTORY :  Past Medical History:  Diagnosis Date  . BPH (benign prostatic hyperplasia)   . Dermatitis herpetiformis   . Diverticulosis   . Erectile dysfunction   . Erythrocytosis   . Esophagitis   . Gastritis   . Gout   . Hyperglycemia   . Hyperlipidemia   . Inappropriate sinus node tachycardia   . Internal hemorrhoids   . Osteoarthritis   . Polycythemia   . Prediabetes   . Right rotator cuff tear   . Sleep apnea     PAST SURGICAL HISTORY :   Past Surgical History:  Procedure Laterality Date  . COLONOSCOPY WITH ESOPHAGOGASTRODUODENOSCOPY (EGD)  03/28/2007    FAMILY HISTORY :  No family history on file.  SOCIAL HISTORY:   Social History   Tobacco Use  . Smoking status: Never Smoker  . Smokeless tobacco: Never Used  Substance Use Topics  . Alcohol use: Yes    Comment: Drinks 6 or 8 beers on the weekend  . Drug use: No    ALLERGIES:  has No Known Allergies.  MEDICATIONS:  Current Outpatient Medications  Medication Sig Dispense Refill  . tamsulosin (FLOMAX) 0.4 MG CAPS capsule Take 1 capsule by mouth daily.    Marland Kitchen acetaminophen (TYLENOL) 500 MG tablet Take by mouth.    Marland Kitchen allopurinol (ZYLOPRIM) 300 MG tablet     . aspirin EC 81 MG tablet Take 81 mg by mouth daily.    . carvedilol (COREG) 3.125 MG tablet Take 3.125 mg by mouth 2 (two) times daily.    Marland Kitchen  ciprofloxacin (CIPRO) 500 MG tablet Take 500 mg by mouth 2 (two) times daily. for 10 days    . dapsone 25 MG tablet Take 25 mg by mouth daily.  0  . desipramine (NORPRAMIN) 25 MG tablet Take 1 tablet (25 mg total) by mouth daily. 30 tablet 3  . dicyclomine (BENTYL) 20 MG tablet Take 1 tablet (20 mg total) by mouth 3 (three) times daily before meals. 90 tablet 2   . omeprazole (PRILOSEC) 20 MG capsule      No current facility-administered medications for this visit.     PHYSICAL EXAMINATION: ECOG PERFORMANCE STATUS: 0 - Asymptomatic  BP 138/87   Pulse 78   Temp 99.2 F (37.3 C) (Tympanic)   Ht 5\' 5"  (1.651 m)   BMI 33.95 kg/m   There were no vitals filed for this visit.  Physical Exam  Constitutional: He is oriented to person, place, and time and well-developed, well-nourished, and in no distress.  HENT:  Head: Normocephalic and atraumatic.  Mouth/Throat: Oropharynx is clear and moist. No oropharyngeal exudate.  Eyes: Pupils are equal, round, and reactive to light.  Neck: Normal range of motion. Neck supple.  Cardiovascular: Normal rate and regular rhythm.  Pulmonary/Chest: Effort normal and breath sounds normal. No respiratory distress. He has no wheezes.  Abdominal: Soft. Bowel sounds are normal. He exhibits no distension and no mass. There is no abdominal tenderness. There is no rebound and no guarding.  Musculoskeletal: Normal range of motion.        General: No tenderness or edema.  Neurological: He is alert and oriented to person, place, and time.  Skin: Skin is warm.  Psychiatric: Affect normal.    LABORATORY DATA:  I have reviewed the data as listed No results found for: NA, K, CL, CO2, GLUCOSE, BUN, CREATININE, CALCIUM, PROT, ALBUMIN, AST, ALT, ALKPHOS, BILITOT, GFRNONAA, GFRAA  No results found for: SPEP, UPEP  Lab Results  Component Value Date   WBC 7.8 03/02/2019   NEUTROABS 4.6 03/02/2019   HGB 16.3 03/02/2019   HCT 48.1 03/02/2019   MCV 87.8 03/02/2019   PLT 236 03/02/2019      Chemistry   No results found for: NA, K, CL, CO2, BUN, CREATININE, GLU No results found for: CALCIUM, ALKPHOS, AST, ALT, BILITOT     RADIOGRAPHIC STUDIES: I have personally reviewed the radiological images as listed and agreed with the findings in the report. No results found.   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  Today hematocrit is 48.  Review of previous hematocrits has shown the patient's hematocrit has not been above 55.  Patient is asymptomatic.  #Discussed with the patient that I would not recommend phlebotomy unless hematocrit greater than 53.  Patient has multiple blood draws with his PCP/other specialist.  He feels comfortable letting us know if his hematocrit goes above 53 for possible phlebotomy.  #Increased frequency of urination/incomplete emptying-currently on Flomax.  Defer to PCP for further work-up.  #Patient will follow-up with [place] as needed; continue follow-up with PCP.  # NO phlebotomy today # Follow-up as needed- Dr.B  Cc; Dr.Sparks    No orders of the defined types were placed in this encounter.  2:34 PM

## 2019-03-02 NOTE — Assessment & Plan Note (Addendum)
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  Today hematocrit is 48.  Review of previous hematocrits has shown the patient's hematocrit has not been above 55.  Patient is asymptomatic.  #Discussed with the patient that I would not recommend phlebotomy unless hematocrit greater than 53.  Patient has multiple blood draws with his PCP/other specialist.  He feels comfortable letting us know if his hematocrit goes above 53 for possible phlebotomy.  #Increased frequency of urination/incomplete emptying-currently on Flomax.  Defer to PCP for further work-up.  #Patient will follow-up with [place] as needed; continue follow-up with PCP.  # NO phlebotomy today # Follow-up as needed- Dr.B  Cc; Dr.Sparks

## 2019-03-13 DIAGNOSIS — R3915 Urgency of urination: Secondary | ICD-10-CM | POA: Diagnosis not present

## 2019-03-13 DIAGNOSIS — N401 Enlarged prostate with lower urinary tract symptoms: Secondary | ICD-10-CM | POA: Diagnosis not present

## 2019-03-13 DIAGNOSIS — R350 Frequency of micturition: Secondary | ICD-10-CM | POA: Diagnosis not present

## 2019-04-06 ENCOUNTER — Other Ambulatory Visit: Payer: Self-pay | Admitting: Family Medicine

## 2019-04-06 DIAGNOSIS — N401 Enlarged prostate with lower urinary tract symptoms: Secondary | ICD-10-CM

## 2019-04-06 DIAGNOSIS — R350 Frequency of micturition: Secondary | ICD-10-CM

## 2019-04-07 ENCOUNTER — Other Ambulatory Visit
Admission: RE | Admit: 2019-04-07 | Discharge: 2019-04-07 | Disposition: A | Discharge status: Home / Self Care | Payer: Medicare HMO | Attending: Urology | Admitting: Urology

## 2019-04-07 ENCOUNTER — Other Ambulatory Visit: Payer: Self-pay

## 2019-04-07 ENCOUNTER — Ambulatory Visit (INDEPENDENT_AMBULATORY_CARE_PROVIDER_SITE_OTHER): Payer: Medicare HMO | Admitting: Urology

## 2019-04-07 ENCOUNTER — Encounter: Payer: Self-pay | Admitting: Urology

## 2019-04-07 VITALS — BP 149/93 | HR 86 | Ht 65.0 in | Wt 200.0 lb

## 2019-04-07 DIAGNOSIS — N401 Enlarged prostate with lower urinary tract symptoms: Secondary | ICD-10-CM | POA: Diagnosis not present

## 2019-04-07 DIAGNOSIS — R3915 Urgency of urination: Secondary | ICD-10-CM | POA: Insufficient documentation

## 2019-04-07 DIAGNOSIS — R350 Frequency of micturition: Secondary | ICD-10-CM

## 2019-04-07 LAB — URINALYSIS, COMPLETE (UACMP) WITH MICROSCOPIC
Bilirubin Urine: NEGATIVE
Glucose, UA: NEGATIVE mg/dL
Hgb urine dipstick: NEGATIVE
Ketones, ur: 15 mg/dL — AB
Leukocytes,Ua: NEGATIVE
Nitrite: NEGATIVE
Protein, ur: NEGATIVE mg/dL
Specific Gravity, Urine: 1.025 (ref 1.005–1.030)
Squamous Epithelial / HPF: NONE SEEN (ref 0–5)
pH: 5 (ref 5.0–8.0)

## 2019-04-07 LAB — BLADDER SCAN AMB NON-IMAGING

## 2019-04-07 NOTE — Patient Instructions (Signed)
Cystoscopy Cystoscopy is a procedure that is used to help diagnose and sometimes treat conditions that affect the lower urinary tract. The lower urinary tract includes the bladder and the urethra. The urethra is the tube that drains urine from the bladder. Cystoscopy is done using a thin, tube-shaped instrument with a light and camera at the end (cystoscope). The cystoscope may be hard or flexible, depending on the goal of the procedure. The cystoscope is inserted through the urethra, into the bladder. Cystoscopy may be recommended if you have:  Urinary tract infections that keep coming back.  Blood in the urine (hematuria).  An inability to control when you urinate (urinary incontinence) or an overactive bladder.  Unusual cells found in a urine sample.  A blockage in the urethra, such as a urinary stone.  Painful urination.  An abnormality in the bladder found during an intravenous pyelogram (IVP) or CT scan. Cystoscopy may also be done to remove a sample of tissue to be examined under a microscope (biopsy). Tell a health care provider about:  Any allergies you have.  All medicines you are taking, including vitamins, herbs, eye drops, creams, and over-the-counter medicines.  Any problems you or family members have had with anesthetic medicines.  Any blood disorders you have.  Any surgeries you have had.  Any medical conditions you have.  Whether you are pregnant or may be pregnant. What are the risks? Generally, this is a safe procedure. However, problems may occur, including:  Infection.  Bleeding.  Allergic reactions to medicines.  Damage to other structures or organs. What happens before the procedure?  Ask your health care provider about: ? Changing or stopping your regular medicines. This is especially important if you are taking diabetes medicines or blood thinners. ? Taking medicines such as aspirin and ibuprofen. These medicines can thin your blood. Do not take  these medicines unless your health care provider tells you to take them. ? Taking over-the-counter medicines, vitamins, herbs, and supplements.  Follow instructions from your health care provider about eating or drinking restrictions.  Ask your health care provider what steps will be taken to help prevent infection. These may include: ? Washing skin with a germ-killing soap. ? Taking antibiotic medicine.  You may have an exam or testing, such as: ? X-rays of the bladder, urethra, or kidneys. ? Urine tests to check for signs of infection.  Plan to have someone take you home from the hospital or clinic. What happens during the procedure?   You will be given one or more of the following: ? A medicine to help you relax (sedative). ? A medicine to numb the area (local anesthetic).  The area around the opening of your urethra will be cleaned.  The cystoscope will be passed through your urethra into your bladder.  Germ-free (sterile) fluid will flow through the cystoscope to fill your bladder. The fluid will stretch your bladder so that your health care provider can clearly examine your bladder walls.  Your doctor will look at the urethra and bladder. Your doctor may take a biopsy or remove stones.  The cystoscope will be removed, and your bladder will be emptied. The procedure may vary among health care providers and hospitals. What can I expect after the procedure? After the procedure, it is common to have:  Some soreness or pain in your abdomen and urethra.  Urinary symptoms. These include: ? Mild pain or burning when you urinate. Pain should stop within a few minutes after you urinate. This   may last for up to 1 week. ? A small amount of blood in your urine for several days. ? Feeling like you need to urinate but producing only a small amount of urine. Follow these instructions at home: Medicines  Take over-the-counter and prescription medicines only as told by your health care  provider.  If you were prescribed an antibiotic medicine, take it as told by your health care provider. Do not stop taking the antibiotic even if you start to feel better. General instructions  Return to your normal activities as told by your health care provider. Ask your health care provider what activities are safe for you.  Do not drive for 24 hours if you were given a sedative during your procedure.  Watch for any blood in your urine. If the amount of blood in your urine increases, call your health care provider.  Follow instructions from your health care provider about eating or drinking restrictions.  If a tissue sample was removed for testing (biopsy) during your procedure, it is up to you to get your test results. Ask your health care provider, or the department that is doing the test, when your results will be ready.  Drink enough fluid to keep your urine pale yellow.  Keep all follow-up visits as told by your health care provider. This is important. Contact a health care provider if you:  Have pain that gets worse or does not get better with medicine, especially pain when you urinate.  Have trouble urinating.  Have more blood in your urine. Get help right away if you:  Have blood clots in your urine.  Have abdominal pain.  Have a fever or chills.  Are unable to urinate. Summary  Cystoscopy is a procedure that is used to help diagnose and sometimes treat conditions that affect the lower urinary tract.  Cystoscopy is done using a thin, tube-shaped instrument with a light and camera at the end.  After the procedure, it is common to have some soreness or pain in your abdomen and urethra.  Watch for any blood in your urine. If the amount of blood in your urine increases, call your health care provider.  If you were prescribed an antibiotic medicine, take it as told by your health care provider. Do not stop taking the antibiotic even if you start to feel better. This  information is not intended to replace advice given to you by your health care provider. Make sure you discuss any questions you have with your health care provider. Document Released: 07/06/2000 Document Revised: 07/01/2018 Document Reviewed: 07/01/2018 [employer] Patient Education  2020 [employer]  

## 2019-04-07 NOTE — Progress Notes (Addendum)
04/07/19 2:19 PM   Jorge Mose Dickerson Sr. 02-16-1951 [id number]  Referring provider: Idelle Crouch, MD [place],  [place] [id number]  CC: Urinary urgency/frequency  HPI: I saw [name] in urology clinic today in consultation for severe urinary urgency and frequency from Dr. Doy Hutching.  He is a 68 year old male with a strong family history of prostate cancer in his father, brother(who passed away at age 26 from prostate cancer), as well as another brother that was treated with brachytherapy who presents with 6 weeks of severe urinary urgency and frequency every 15 to 30 minutes during the day.  He also has urgency and frequency overnight, but it seems to be less severe.  PVR in clinic today is 0 mL.  He reports an E. coli UTI with symptoms of dysuria, urgency, and frequency in May 2020 that was treated with 10 days of Cipro, and his symptoms resolved.  He had hematuria with that UTI, however denies any other gross hematuria.  He had worsening of his urgency and frequency a few months later and a urine culture was negative, however he was put on another 2-week course of Cipro and Flomax.  He has been tried on numerous medications over the last few months including flavoxate, Myrbetriq, Flomax, and finasteride.  None of these medications have improved his urinary urgency or frequency.  He denies any episodes of urinary retention in the past.  Urinalysis is benign today with 0-5 RBCs, 0-5 WBCs, rare bacteria, nitrite negative, pH 5.0.  He denies any flank or pelvic pain.  There are no aggravating or alleviating factors.  Severity is severe.  PSA on 11/28/2018 was normal at 1.51.  He is a never smoker, and previously worked in [employer].  PVR 0 mL.    PMH: Past Medical History:  Diagnosis Date  . BPH (benign prostatic hyperplasia)   . Dermatitis herpetiformis   . Diverticulosis   . Erectile dysfunction   . Erythrocytosis   . Esophagitis   . Gastritis   . Gout   .  Hyperglycemia   . Hyperlipidemia   . Inappropriate sinus node tachycardia   . Internal hemorrhoids   . Osteoarthritis   . Polycythemia   . Prediabetes   . Right rotator cuff tear   . Sleep apnea     Surgical History: Past Surgical History:  Procedure Laterality Date  . COLONOSCOPY WITH ESOPHAGOGASTRODUODENOSCOPY (EGD)  03/28/2007    Allergies: No Known Allergies  Family History: Family History  Problem Relation Age of Onset  . Prostate cancer Father   . Prostate cancer Brother   . Prostate cancer Brother     Social History:  reports that he has never smoked. He has never used smokeless tobacco. He reports current alcohol use. He reports that he does not use drugs.  ROS: Please see flowsheet from today's date for complete review of systems.  Physical Exam: BP (!) 149/93 (BP Location: Left Arm, Patient Position: Sitting, Cuff Size: Normal)   Pulse 86   Ht 5\' 5"  (1.651 m)   Wt 200 lb (90.7 kg)   BMI 33.28 kg/m    Constitutional:  Alert and oriented, No acute distress. Cardiovascular: No clubbing, cyanosis, or edema. Respiratory: Normal respiratory effort, no increased work of breathing. GI: Abdomen is soft, nontender, nondistended, no abdominal masses GU: phallus without lesions,widely patent meatus Lymph: No cervical or inguinal lymphadenopathy. Skin: No rashes, bruises or suspicious lesions. Neurologic: Grossly intact, no focal deficits, moving all  4 extremities. Psychiatric: Normal mood and affect.  Laboratory Data: See HPI  Pertinent Imaging: None to review  Assessment & Plan:   In summary, the patient is a 68 year old male with a strong family history of prostate cancer, normal PSA, UTI in May 2020, and 6 weeks of severe urinary urgency, urge incontinence, and frequency that is extremely bothersome and is not improved with a number of different medication approaches.  We discussed possible etiology of his symptoms including bladder lesions, urethral  stricture, interstitial cystitis, or even external compression of the bladder.  -RTC ASAP for cystoscopy -Consider cross-sectional imaging if cystoscopy negative with his severe symptoms and history of UTI  Billey Co, MD  [place] [address], [place], [place] [address] [phone]

## 2019-04-08 ENCOUNTER — Encounter: Payer: Self-pay | Admitting: Urology

## 2019-04-08 ENCOUNTER — Ambulatory Visit (INDEPENDENT_AMBULATORY_CARE_PROVIDER_SITE_OTHER): Payer: Medicare HMO | Admitting: Urology

## 2019-04-08 VITALS — BP 154/98 | HR 71 | Ht 65.0 in | Wt 200.0 lb

## 2019-04-08 DIAGNOSIS — R350 Frequency of micturition: Secondary | ICD-10-CM | POA: Diagnosis not present

## 2019-04-08 DIAGNOSIS — R896 Abnormal cytological findings in specimens from other organs, systems and tissues: Secondary | ICD-10-CM | POA: Diagnosis not present

## 2019-04-08 DIAGNOSIS — R109 Unspecified abdominal pain: Secondary | ICD-10-CM

## 2019-04-08 NOTE — Patient Instructions (Signed)

## 2019-04-08 NOTE — Progress Notes (Signed)
Cystoscopy Procedure Note:  Indication: Severe urinary urgency/frequency every 5-15 minutes for >1 month, evaluate for stricture/bladder lesion  After informed consent and discussion of the procedure and its risks, Jorge Zucconi Hott Sr. was positioned and prepped in the standard fashion. Cystoscopy was performed with a flexible cystoscope. The urethra, bladder neck and entire bladder was visualized in a standard fashion. The prostate was short with a high bladder neck. The ureteral orifices were visualized in their normal location and orientation. No abnormalities on retroflexion. Cytology sent. Bladder mucosa grossly normal throughout.  Findings: Normal cystoscopy  Assessment and Plan: Trial of Mybetriq 50mg  daily CT A/P to r/u stone or external compression of bladder Call with CT results  Nickolas Madrid, MD 04/08/2019

## 2019-04-10 ENCOUNTER — Ambulatory Visit: Payer: Self-pay | Admitting: Urology

## 2019-04-13 ENCOUNTER — Other Ambulatory Visit: Payer: Self-pay | Admitting: Urology

## 2019-04-14 ENCOUNTER — Ambulatory Visit: Payer: Self-pay | Admitting: Urology

## 2019-04-20 DIAGNOSIS — R69 Illness, unspecified: Secondary | ICD-10-CM | POA: Diagnosis not present

## 2019-04-21 ENCOUNTER — Ambulatory Visit
Admission: RE | Admit: 2019-04-21 | Discharge: 2019-04-21 | Disposition: A | Discharge status: Home / Self Care | Payer: Medicare HMO | Source: Ambulatory Visit | Attending: Urology | Admitting: Urology

## 2019-04-21 ENCOUNTER — Other Ambulatory Visit: Payer: Self-pay

## 2019-04-21 DIAGNOSIS — R109 Unspecified abdominal pain: Secondary | ICD-10-CM | POA: Insufficient documentation

## 2019-04-21 DIAGNOSIS — K8020 Calculus of gallbladder without cholecystitis without obstruction: Secondary | ICD-10-CM | POA: Diagnosis not present

## 2019-04-21 LAB — POCT I-STAT CREATININE: Creatinine, Ser: 1.1 mg/dL (ref 0.61–1.24)

## 2019-04-21 MED ORDER — IOHEXOL 300 MG/ML  SOLN
100.0000 mL | Freq: Once | INTRAMUSCULAR | Status: AC | PRN
  Administered 2019-04-21: 13:00:00 100 mL via INTRAVENOUS

## 2019-04-24 ENCOUNTER — Telehealth: Payer: Self-pay | Admitting: Urology

## 2019-04-24 ENCOUNTER — Telehealth: Payer: Self-pay

## 2019-04-24 NOTE — Telephone Encounter (Signed)
-----   Message from Billey Co, MD sent at 04/23/2019  5:08 PM EDT ----- Judson Roch- please let him know nothing worrisome from a urology perspective on the CT. There was a small 1cm spot in the liver they recommended considering an MRI to look at better.  Sharyn Lull- will you forward results to his PCP to order the follow up MRI for liver lesion? Please schedule follow up with me in clinic in 4-6 weeks for symptom check. Thx  Nickolas Madrid, MD 04/23/2019

## 2019-04-24 NOTE — Telephone Encounter (Signed)
App made Results sent to PCP   [facility]

## 2019-04-27 ENCOUNTER — Other Ambulatory Visit: Payer: Self-pay | Admitting: Family Medicine

## 2019-04-27 ENCOUNTER — Other Ambulatory Visit: Payer: Self-pay | Admitting: Urology

## 2019-04-27 DIAGNOSIS — N3281 Overactive bladder: Secondary | ICD-10-CM

## 2019-04-27 MED ORDER — CELECOXIB 100 MG PO CAPS: 100.0000 mg | ORAL_CAPSULE | Freq: Two times a day (BID) | ORAL | 0 refills | Status: AC

## 2019-04-27 NOTE — Progress Notes (Signed)
UROLOGY TELEPHONE NOTE  I had a phone discussion with the patient's wife today regarding his refractory urinary symptoms.  To briefly summarize, he is a 68 year old male with chronic urinary symptoms, but acutely worsened over the last few months with severe urinary urgency and frequency every 10 minutes during the day as well as sometimes at night.  His symptoms acutely worsened after developing an E Coli in May 2020, but persisted even after culture appropriate antibiotic treatment.  UTI he noted no improvement on Flomax and anticholinergics previously.  He has had extensive work-up thus far.  Urinalysis was negative on 04/07/2019, and PSA was normal at 1.5 in May 2020.  PVR in clinic has been 0 mL.  He underwent cystoscopy on 04/08/2019 that was notable for a short prostate with a high bladder neck, however bladder mucosa was grossly normal.  Of note cytology was atypical.  He underwent a CT urogram on 04/21/2019 that was essentially benign aside from possible mild subtle diverticulitis, fatty liver, and 1 cm nodular density in the liver adjacent to the gallbladder, and cholelithiasis.  His PCP placed a GI consult for the above findings.  The urologic tract appeared normal and prostate measured 40 g.  He has been on 50 mg Myrbetriq for the last 2 weeks with no improvement of his symptoms.  -I recommended a course of Celebrex x2 weeks, followed by urodynamics in [place].  Would determine further treatment based on urodynamic results.  Could also consider cystoscopy and random bladder biopsy in the future for the atypical cytology, however the bladder was grossly normal-appearing.  -Keep follow-up on 05/21/2019  Nickolas Madrid, MD 04/27/2019

## 2019-04-28 NOTE — Telephone Encounter (Signed)
All done   [facility]

## 2019-04-28 NOTE — Telephone Encounter (Signed)
-----   Message from Billey Co, MD sent at 04/23/2019  5:08 PM EDT ----- Judson Roch- please let him know nothing worrisome from a urology perspective on the CT. There was a small 1cm spot in the liver they recommended considering an MRI to look at better.  Sharyn Lull- will you forward results to his PCP to order the follow up MRI for liver lesion? Please schedule follow up with me in clinic in 4-6 weeks for symptom check. Thx  Nickolas Madrid, MD 04/23/2019

## 2019-05-04 DIAGNOSIS — R3915 Urgency of urination: Secondary | ICD-10-CM | POA: Diagnosis not present

## 2019-05-04 DIAGNOSIS — R351 Nocturia: Secondary | ICD-10-CM | POA: Diagnosis not present

## 2019-05-12 ENCOUNTER — Other Ambulatory Visit: Payer: Self-pay | Admitting: Urology

## 2019-05-21 ENCOUNTER — Encounter: Payer: Self-pay | Admitting: Urology

## 2019-05-21 ENCOUNTER — Ambulatory Visit: Payer: Medicare HMO | Admitting: Urology

## 2019-05-21 ENCOUNTER — Other Ambulatory Visit: Payer: Self-pay

## 2019-05-21 VITALS — BP 148/87 | HR 74 | Ht 65.0 in | Wt 195.0 lb

## 2019-05-21 DIAGNOSIS — M6289 Other specified disorders of muscle: Secondary | ICD-10-CM | POA: Diagnosis not present

## 2019-05-21 DIAGNOSIS — N3281 Overactive bladder: Secondary | ICD-10-CM

## 2019-05-21 LAB — URINALYSIS, COMPLETE
Bilirubin, UA: NEGATIVE
Glucose, UA: NEGATIVE
Ketones, UA: NEGATIVE
Leukocytes,UA: NEGATIVE
Nitrite, UA: NEGATIVE
Protein,UA: NEGATIVE
RBC, UA: NEGATIVE
Specific Gravity, UA: 1.025 (ref 1.005–1.030)
Urobilinogen, Ur: 0.2 mg/dL (ref 0.2–1.0)
pH, UA: 5.5 (ref 5.0–7.5)

## 2019-05-21 LAB — MICROSCOPIC EXAMINATION
Bacteria, UA: NONE SEEN
Epithelial Cells (non renal): NONE SEEN /hpf (ref 0–10)
RBC, Urine: NONE SEEN /hpf (ref 0–2)

## 2019-05-21 LAB — BLADDER SCAN AMB NON-IMAGING

## 2019-05-21 NOTE — Progress Notes (Signed)
05/21/2019 4:44 PM   Jorge Diaz. Aug 18, 1950 [id number]  Reason for visit: Follow up urodynamic results, severe urinary symptoms  HPI: I saw Jorge Diaz back in urology clinic to discuss his urinary symptoms.  He is a complicated 68 year old male with unique symptoms of severe urinary frequency and urgency, and a tingling/painful bothersome sensation in the penis and pelvis.  He continues to empty his bladder well with a PVR of 0 mL, and urinalysis is benign today with 0-5 WBCs, 0 RBCs, no bacteria, nitrite negative, no yeast.  He has had an extensive work-up including benign cystoscopy, CT abdomen pelvis with contrast with mild subtle diverticulitis but no urologic abnormalities.  On urine cytology, he did have some atypical cells from cystoscopy, but the bladder mucosa was grossly normal throughout.  He most recently underwent urodynamics on 05/04/2019 that was relatively benign.  Max capacity was 246 mL, for sensation at 21 mL, normal desire at 135 mL, strong desire at 173 mL.  He was uncomfortable with the max capacity and reported pressure and discomfort in the penis with 10 out of 10 pain.  He did not have any significant bladder instability during filling.  Max flow was 8 mL/s, detrusor pressure at peak flow was 20 cm H20.  PSA is normal at 1.5.  We have tried a number of things so far.  With a prior provider he tried Flomax and an unspecified anticholinergic that reportedly did not improve his pain.  He also has tried Myrbetriq extensively with no improvement.  Most recently, he was given a course of Cipro/Flagyl for his possible diverticulitis, and he also had a course of 2 weeks of Celebrex at the same time and he feels his symptoms improved significantly after taking these medications, but is unsure if it was the antibiotics or the Celebrex.  He does report that after finishing the Celebrex his urinary symptoms flared up again.  He continues to be extremely bothered by frequency every 30 to  40 minutes, multiple episodes of nocturia, and his bothersome tingling/painful penile/pelvis sensation.  I had a long conversation with the patient and he continues to be very frustrated with his symptoms understandably.  We discussed the spectrum of urinary symptoms including overactive bladder, pelvic floor dysfunction, and interstitial cystitis.  I do not think this is a BPH issue.  I was honest with the patient that I am not entirely sure what is causing his symptoms, as his story is very unique.  His symptoms do tend flare intermittently and are not consistent.  We discussed the stepwise fashion of work-up and trials of different medication and treatment strategies to find what works best for him.  We discussed his atypical cytology, though I do not think this is the issue-we could consider bladder biopsies in the OR in the future if persistent symptoms to rule out CIS, however the bladder mucosa was grossly normal in clinic cystoscopy.  -I recommended a trial of Toviaz, and 8 mg samples were given today -Referral placed to pelvic floor physical therapy -Information on interstitial cystitis, and interstitial cystitis diet given today -RTC 4 to 6 weeks for virtual visit for symptom check.  Consider amitriptyline or Uribel at that time if persistent discomfort   A total of 15 minutes were spent face-to-face with the patient, greater than 50% was spent in patient education, counseling, and coordination of care regarding overactive bladder, pelvic pain, pelvic floor dysfunction, and interstitial cystitis.  Billey Co, MD  [facility]  [address], [place], [place] [address] [phone]

## 2019-05-21 NOTE — Patient Instructions (Addendum)
Interstitial Cystitis  Interstitial cystitis is inflammation of the bladder. This may cause pain in the bladder area as well as a frequent and urgent need to urinate. The bladder is a hollow organ in the lower part of the abdomen. It stores urine after the urine is made in the kidneys. The severity of interstitial cystitis can vary from person to person. You may have flare-ups, and then your symptoms may go away for a while. For many people, it becomes a long-term (chronic) problem. What are the causes? The cause of this condition is not known. What increases the risk? The following factors may make you more likely to develop this condition:  You are male.  You have fibromyalgia.  You have irritable bowel syndrome (IBS).  You have endometriosis. This condition may be aggravated by:  Stress.  Smoking.  Spicy foods. What are the signs or symptoms? Symptoms of interstitial cystitis vary, and they can change over time. Symptoms may include:  Discomfort or pain in the bladder area, which is in the lower abdomen. Pain can range from mild to severe. The pain may change in intensity as the bladder fills with urine or as it empties.  Pain in the pelvic area, between the hip bones.  An urgent need to urinate.  Frequent urination.  Pain during urination.  Pain during sex.  Blood in the urine. For women, symptoms often get worse during menstruation. How is this diagnosed? This condition is diagnosed based on your symptoms, your medical history, and a physical exam. You may have tests to rule out other conditions, such as:  Urine tests.  Cystoscopy. For this test, a tool similar to a very thin telescope is used to look into your bladder.  Biopsy. This involves taking a sample of tissue from the bladder to be examined under a microscope. How is this treated? There is no cure for this condition, but treatment can help you control your symptoms. Work closely with your health care  provider to find the most effective treatments for you. Treatment options may include:  Medicines to relieve pain and reduce how often you feel the need to urinate.  Learning ways to control when you urinate (bladder training).  Lifestyle changes, such as changing your diet or taking steps to control stress.  Using a device that provides electrical stimulation to your nerves, which can relieve pain (neuromodulation therapy). The device is placed on your back, where it blocks the nerves that cause you to feel pain in your bladder area.  A procedure that stretches your bladder by filling it with air or fluid.  Surgery. This is rare. It is only done for extreme cases, if other treatments do not help. Follow these instructions at home: Bladder training   Use bladder training techniques as directed. Techniques may include: ? Urinating at scheduled times. ? Training yourself to delay urination. ? Doing exercises (Kegel exercises) to strengthen the muscles that control urine flow.  Keep a bladder diary. ? Write down the times that you urinate and any symptoms that you have. This can help you find out which foods, liquids, or activities make your symptoms worse. ? Use your bladder diary to schedule bathroom trips. If you are away from home, plan to be near a bathroom at each of your scheduled times.  Make sure that you urinate just before you leave the house and just before you go to bed. Eating and drinking  Make dietary changes as recommended by your health care provider. You  may need to avoid: ? Spicy foods. ? Foods that contain a lot of potassium.  Limit your intake of beverages that make you need to urinate. These include: ? Caffeinated beverages like soda, coffee, and tea. ? Alcohol. General instructions  Take over-the-counter and prescription medicines only as told by your health care provider.  Do not drink alcohol.  You can try a warm or cool compress over your bladder for  comfort.  Avoid wearing tight clothing.  Do not use any products that contain nicotine or tobacco, such as cigarettes and e-cigarettes. If you need help quitting, ask your health care provider.  Keep all follow-up visits as told by your health care provider. This is important. Contact a health care provider if you have:  Symptoms that do not get better with treatment.  Pain or discomfort that gets worse.  More frequent urges to urinate.  A fever. Get help right away if:  You have no control over when you urinate. Summary  Interstitial cystitis is inflammation of the bladder.  This condition may cause pain in the bladder area as well as a frequent and urgent need to urinate.  You may have flare-ups of the condition, and then it may go away for a while. For many people, it becomes a long-term (chronic) problem.  There is no cure for interstitial cystitis, but treatment methods are available to control your symptoms. This information is not intended to replace advice given to you by your health care provider. Make sure you discuss any questions you have with your health care provider. Document Released: 03/09/2004 Document Revised: 06/21/2017 Document Reviewed: 06/03/2017 Elsevier Patient Education  2020 [place] for Interstitial Cystitis Interstitial cystitis (IC) is a long-term (chronic) condition that causes pain and pressure in the bladder, the lower abdomen, and the pelvic area. Other symptoms of IC include urinary urgency and frequency. Symptoms tend to come and go. Many people with IC find that certain foods trigger their symptoms. Different foods may be problematic for different people. Some foods are more likely to cause symptoms than others. Learning which foods bother you and which do not can help you come up with an eating plan to manage IC. What are tips for following this plan? You may find it helpful to work with a dietitian. This health care  provider can help you develop your eating plan by doing an elimination diet, which involves these steps:  Start with a list of foods that you think trigger your IC symptoms along with the foods that most commonly trigger symptoms for many people with IC.  Eliminate those foods from your diet for about one month, then start reintroducing the foods one at a time to see which ones trigger your symptoms.  Make a list of the foods that trigger your symptoms. It may take several months to find out which foods bother you. Reading food labels Once you know which foods trigger your IC symptoms, you can avoid them. However, it is also a good idea to read food labels because some foods that trigger your symptoms may be included as ingredients in other foods. These ingredients may include:  Chili peppers.  Tomato products.  Soy.  Worcestershire sauce.  Vinegar.  Alcohol.  Citrus flavors or juices.  Artificial sweeteners.  Monosodium glutamate. Shopping  Shopping can be a challenge if many foods trigger your IC. When you go grocery shopping, bring a list of the foods you can eat.  You can get an app  for your phone that lets you know which foods are the safest and which you may want to avoid. You can find the app at the Interstitial Cystitis Network website: www.ic-network.com Meal planning  Plan your meals according to the results of your elimination diet. If you have not done an elimination diet, plan meals according to IC food lists recommended by your health care provider or dietitian. These lists tell you which foods are least and most likely to cause symptoms.  Avoid certain types of food when you go out to eat, such as pizza and foods typically served at [place], [place], and [place]. These foods often contain ingredients that can aggravate IC. General information Here are some general guidelines for an IC eating plan:  Do not eat large portions.  Drink plenty of fluids  with your meals.  Do not eat foods that are high in sugar, salt, or saturated fat.  Choose whole fruits instead of juice.  Eat a colorful variety of vegetables. What foods should I eat? For people with IC, the best diet is a balanced one that includes things from all the food groups. Even if you have to avoid certain foods, there are still plenty of healthy choices in each group. The following are some foods that are least bothersome and may be safest to eat: Fruits Bananas. Blueberries and blueberry juice. Melons. Pears. Apples. Dates. Prunes. Raisins. Apricots. Vegetables Asparagus. Avocado. Celery. Beets. Bell peppers. Black olives. Broccoli. Brussels sprouts. Cabbage. Carrots. Cauliflower. Cucumber. Eggplant. Green beans. Potatoes. Radishes. Spinach. Squash. Turnips. Zucchini. Mushrooms. Peas. Grains Oats. Rice. Bran. Oatmeal. Whole wheat bread. Meats and other proteins Beef. Fish and other seafood. Eggs. Nuts. Peanut butter. Pork. Poultry. Lamb. Garbanzo beans. Pinto beans. Dairy Whole or low-fat milk. American, mozzarella, mild cheddar, feta, ricotta, and cream cheeses. The items listed above may not be a complete list of foods and beverages you can eat. Contact a dietitian for more information. What foods should I avoid? You should avoid any foods that seem to trigger your symptoms. It is also a good idea to avoid foods that are most likely to cause symptoms in many people with IC. These include the following: Fruits Citrus fruits, including lemons, limes, oranges, and grapefruit. Cranberries. Strawberries. Pineapple. Kiwi. Vegetables Chili peppers. Onions. Sauerkraut. Tomato and tomato products. Angie Fava. Grains You do not need to avoid any type of grain unless it triggers your symptoms. Meats and other proteins Precooked or cured meats, such as sausages or meat loaves. Soy products. Dairy Chocolate ice cream. Processed cheese. Yogurt. Beverages Alcohol. Chocolate drinks.  Coffee. Cranberry juice. Carbonated drinks. Tea (black, green, or herbal). Tomato juice. Sports drinks. The items listed above may not be a complete list of foods and beverages you should avoid. Contact a dietitian for more information. Summary  Many people with IC find that certain foods trigger their symptoms. Different foods may be problematic for different people. Some foods are more likely to cause symptoms than others.  You may find it helpful to work with a dietitian to do an elimination diet and come up with an eating plan that is right for you.  Plan your meals according to the results of your elimination diet. If you have not done an elimination diet, plan your meals using IC food lists. These lists tell you which foods are least and most likely to cause symptoms.  The best diet for people with IC is a balanced diet that includes foods from all the food groups. Even if  you have to avoid certain foods, there are still plenty of healthy choices in each group. This information is not intended to replace advice given to you by your health care provider. Make sure you discuss any questions you have with your health care provider. Document Released: 03/13/2018 Document Revised: 10/30/2018 Document Reviewed: 03/13/2018 Elsevier Patient Education  2020 [employer].

## 2019-05-27 ENCOUNTER — Other Ambulatory Visit: Payer: Self-pay

## 2019-05-27 ENCOUNTER — Ambulatory Visit (INDEPENDENT_AMBULATORY_CARE_PROVIDER_SITE_OTHER): Payer: Medicare HMO | Admitting: Gastroenterology

## 2019-05-27 ENCOUNTER — Encounter: Payer: Self-pay | Admitting: Gastroenterology

## 2019-05-27 ENCOUNTER — Encounter

## 2019-05-27 VITALS — BP 126/79 | HR 67 | Temp 98.5°F | Ht 65.0 in | Wt 194.4 lb

## 2019-05-27 DIAGNOSIS — R933 Abnormal findings on diagnostic imaging of other parts of digestive tract: Secondary | ICD-10-CM

## 2019-05-27 DIAGNOSIS — K5790 Diverticulosis of intestine, part unspecified, without perforation or abscess without bleeding: Secondary | ICD-10-CM

## 2019-05-27 MED ORDER — DIAZEPAM 10 MG PO TABS: ORAL_TABLET | ORAL | 0 refills | Status: DC

## 2019-05-27 NOTE — Progress Notes (Signed)
Primary Care Physician: Idelle Crouch, MD  Primary Gastroenterologist:  Dr. Lucilla Lame  Chief Complaint  Patient presents with  . Diverticulitis    HPI: Jorge Abitz Nucci Sr. is a 68 y.o. male here with a history of what sounds like irritable bowel syndrome.  The patient underwent a CT scan of the abdomen followed by urology that showed:  IMPRESSION: 1. Extensive sigmoid diverticulosis with minimal focal perisigmoid stranding and nodularity which may be chronic or represent mild diverticulitis. Focal adhesion of adjacent small bowel to this portion the sigmoid colon is not excluded. No bowel obstruction. 2. Fatty liver. A 1 cm nodular density in the liver adjacent to the gallbladder fundus may represent a liver lesion or a focal area of adenomyomatosis of the gallbladder fundus. Clinical correlation and attention on follow-up recommended. MRI may provide better evaluation. 3. Cholelithiasis.   The patient had seen me in January and at that time had blood work for celiac sprue done which was negative.  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  The patient's abdominal pain is often accompanied by nausea and vomiting which resolves after his pain resolves.  The patient denies any abdominal pain or change in bowel habits.  He states his irritable bowel syndrome has been relatively under control.  The patient's concern now is mostly that he has a lot of urinary issues and has seen multiple urologists with frequent urination and urgency.  He states that he has to run to the bathroom to urinate and then only a small amount comes out.  He also reports that he saw a local urologist and then was sent to [facility] to see another 1 and has tried multiple medications without relief.  He denies any signs of diverticulitis such as fevers chills nausea vomiting or left-sided abdominal pain.  Current Outpatient Medications  Medication Sig Dispense Refill  . acetaminophen (TYLENOL) 500 MG tablet Take  by mouth.    Marland Kitchen allopurinol (ZYLOPRIM) 300 MG tablet     . aspirin EC 81 MG tablet Take 81 mg by mouth daily.    . dapsone 25 MG tablet Take 25 mg by mouth daily.  0  . fesoterodine (TOVIAZ) 8 MG TB24 tablet     . mirabegron ER (MYRBETRIQ) 50 MG TB24 tablet Take 50 mg by mouth daily.    Marland Kitchen omeprazole (PRILOSEC) 20 MG capsule     . tamsulosin (FLOMAX) 0.4 MG CAPS capsule Take 1 capsule by mouth daily.    . carvedilol (COREG) 3.125 MG tablet Take 3.125 mg by mouth 2 (two) times daily.     No current facility-administered medications for this visit.     Allergies as of 05/27/2019  . (No Known Allergies)    ROS:  General: Negative for anorexia, weight loss, fever, chills, fatigue, weakness. ENT: Negative for hoarseness, difficulty swallowing , nasal congestion. CV: Negative for chest pain, angina, palpitations, dyspnea on exertion, peripheral edema.  Respiratory: Negative for dyspnea at rest, dyspnea on exertion, cough, sputum, wheezing.  GI: See history of present illness. GU:  Negative for dysuria, hematuria, urinary incontinence, urinary frequency, nocturnal urination.  Endo: Negative for unusual weight change.    Physical Examination:   BP 126/79   Pulse 67   Temp 98.5 F (36.9 C) (Temporal)   Ht 5\' 5"  (1.651 m)   Wt 194 lb 6.4 oz (88.2 kg)   BMI 32.35 kg/m   General: Well-nourished, well-developed in no acute distress.  Eyes: No icterus.  Conjunctivae pink. Lungs: Clear to auscultation bilaterally. Non-labored. Heart: Regular rate and rhythm, no murmurs rubs or gallops.  Abdomen: Bowel sounds are normal, nontender, nondistended, no hepatosplenomegaly or masses, no abdominal bruits or hernia , no rebound or guarding.   Extremities: No lower extremity edema. No clubbing or deformities. Neuro: Alert and oriented x 3.  Grossly intact. Skin: Warm and dry, no jaundice.   Psych: Alert and cooperative, normal mood and affect.  Labs:    Imaging Studies: No results found.   Assessment and Plan:   Jorge Argudo Mohler Sr. is a 68 y.o. y/o male who comes in today with a lot of urinary symptoms for which he is following up with urology.  On the CT scan by urology he was found to have diverticulosis with possible chronic/acute diverticulitis.  The patient was started on antibiotics and states that he has no symptoms at the present time.  The patient was also found to have a spot on his liver/gallbladder.  It was not able to be determine if it was the liver or the gallbladder where the lesion was.  A MRI was recommended to look at that area.  The patient will be set up for an MRI to assess that area.  The patient has also been told that since he only had a single episode of the finding of diverticulitis and he has had antibiotics that I would not do anything further since he has only had one occurrence of this.  The patient has been explained the plan and agrees with it.     Lucilla Lame, MD. Marval Regal    Note: This dictation was prepared with Dragon dictation along with smaller phrase technology. Any transcriptional errors that result from this process are unintentional.

## 2019-06-01 ENCOUNTER — Telehealth: Payer: Self-pay | Admitting: Urology

## 2019-06-01 MED ORDER — AMITRIPTYLINE HCL 25 MG PO TABS: 25.0000 mg | ORAL_TABLET | Freq: Every day | ORAL | 3 refills | Status: DC

## 2019-06-01 NOTE — Telephone Encounter (Signed)
OK to stop the [place].  Lets try amitripyline 25mg  nightly for his urinary symptoms, ok to prescribe 30 tabs x 3 refills. This may take a few weeks to work. Keep scheduled follow up in December.   Nickolas Madrid, MD 06/01/2019

## 2019-06-01 NOTE — Telephone Encounter (Signed)
Patient notified, script sent

## 2019-06-01 NOTE — Telephone Encounter (Signed)
Pt called to inform you the new medication Lisbeth Ply) is not working for him. Nothing has changed. Pt would like to know if you have any other ideas. Pt has upcoming appt, states he was advised to call and let you know about medication before his appt.  Please advise pt at [phone]

## 2019-06-04 DIAGNOSIS — Z1331 Encounter for screening for depression: Secondary | ICD-10-CM | POA: Diagnosis not present

## 2019-06-04 DIAGNOSIS — Z0000 Encounter for general adult medical examination without abnormal findings: Secondary | ICD-10-CM | POA: Diagnosis not present

## 2019-06-04 DIAGNOSIS — Z79899 Other long term (current) drug therapy: Secondary | ICD-10-CM | POA: Diagnosis not present

## 2019-06-08 ENCOUNTER — Other Ambulatory Visit: Payer: Self-pay

## 2019-06-08 ENCOUNTER — Ambulatory Visit
Admission: RE | Admit: 2019-06-08 | Discharge: 2019-06-08 | Disposition: A | Discharge status: Home / Self Care | Payer: Medicare HMO | Source: Ambulatory Visit | Attending: Gastroenterology | Admitting: Gastroenterology

## 2019-06-08 ENCOUNTER — Telehealth: Payer: Self-pay | Admitting: Gastroenterology

## 2019-06-08 DIAGNOSIS — K8020 Calculus of gallbladder without cholecystitis without obstruction: Secondary | ICD-10-CM | POA: Diagnosis not present

## 2019-06-08 DIAGNOSIS — R933 Abnormal findings on diagnostic imaging of other parts of digestive tract: Secondary | ICD-10-CM | POA: Diagnosis present

## 2019-06-08 DIAGNOSIS — K760 Fatty (change of) liver, not elsewhere classified: Secondary | ICD-10-CM | POA: Diagnosis not present

## 2019-06-08 LAB — POCT I-STAT CREATININE: Creatinine, Ser: 1.1 mg/dL (ref 0.61–1.24)

## 2019-06-08 MED ORDER — GADOBUTROL 1 MMOL/ML IV SOLN
8.0000 mL | Freq: Once | INTRAVENOUS | Status: AC | PRN
  Administered 2019-06-08: 8 mL via INTRAVENOUS

## 2019-06-08 NOTE — Telephone Encounter (Signed)
Pt left vm he had a MRI today and will be out of town until Sunday  please call cell # for results

## 2019-06-09 ENCOUNTER — Other Ambulatory Visit: Payer: Self-pay

## 2019-06-09 DIAGNOSIS — R933 Abnormal findings on diagnostic imaging of other parts of digestive tract: Secondary | ICD-10-CM

## 2019-06-09 NOTE — Telephone Encounter (Signed)
-----   Message from Lucilla Lame, MD sent at 06/08/2019  5:26 PM EST ----- Let the patient know that the MRI showed the lesion to be a 13 mm lesion in the gallbladder which was thought to be a benign gallbladder lesion with a gallbladder polyp being less likely.  The radiologist recommended a right upper quadrant ultrasound to further characterize this lesion because these types of lesions are seen better on ultrasound and MRI.

## 2019-06-09 NOTE — Telephone Encounter (Signed)
Pt notified of MRI results. He has been scheduled for a RUQ abdominal US on Tuesday, Nov 24th at 9:30am. Pt has been given instructions for this scan via Mychart.

## 2019-06-16 ENCOUNTER — Other Ambulatory Visit: Payer: Self-pay

## 2019-06-16 ENCOUNTER — Ambulatory Visit
Admission: RE | Admit: 2019-06-16 | Discharge: 2019-06-16 | Disposition: A | Discharge status: Home / Self Care | Payer: Medicare HMO | Source: Ambulatory Visit | Attending: Gastroenterology | Admitting: Gastroenterology

## 2019-06-16 DIAGNOSIS — R933 Abnormal findings on diagnostic imaging of other parts of digestive tract: Secondary | ICD-10-CM | POA: Insufficient documentation

## 2019-06-16 DIAGNOSIS — K760 Fatty (change of) liver, not elsewhere classified: Secondary | ICD-10-CM | POA: Diagnosis not present

## 2019-06-16 DIAGNOSIS — K8020 Calculus of gallbladder without cholecystitis without obstruction: Secondary | ICD-10-CM | POA: Diagnosis not present

## 2019-06-17 ENCOUNTER — Telehealth: Payer: Self-pay

## 2019-06-17 NOTE — Telephone Encounter (Signed)
Left vm for pt to return my call regarding [place] results. Also, sent pt a message with results via MyChart.

## 2019-06-17 NOTE — Telephone Encounter (Signed)
-----   Message from Lucilla Lame, MD sent at 06/16/2019  5:47 PM EST ----- Let the patient know that the spot on his gallbladder appeared benign without any worrisome features.  There were some gallstones which do not appear to be causing any problems and the liver showed fatty liver which can be controlled by weight loss and managing cholesterol

## 2019-07-01 ENCOUNTER — Telehealth (INDEPENDENT_AMBULATORY_CARE_PROVIDER_SITE_OTHER): Payer: Medicare HMO | Admitting: Urology

## 2019-07-01 ENCOUNTER — Other Ambulatory Visit: Payer: Self-pay

## 2019-07-01 DIAGNOSIS — N3010 Interstitial cystitis (chronic) without hematuria: Secondary | ICD-10-CM

## 2019-07-01 MED ORDER — AMITRIPTYLINE HCL 50 MG PO TABS: 50.0000 mg | ORAL_TABLET | Freq: Every day | ORAL | 11 refills | Status: DC

## 2019-07-01 NOTE — Progress Notes (Signed)
Virtual Visit via Telephone Note  I connected with Jorge Graulich Domek Sr. on 07/01/19 at 10:15 AM EST by telephone and verified that I am speaking with the correct person using two identifiers.   I discussed the limitations, risks, security and privacy concerns of performing an evaluation and management service by telephone and the availability of in person appointments. We discussed the impact of the COVID-19 pandemic on the healthcare system, and the importance of social distancing and reducing patient and provider exposure. I also discussed with the patient that there may be a patient responsible charge related to this service. The patient expressed understanding and agreed to proceed.  Reason for visit: Irritative voiding symptoms  History of Present Illness: I had phone follow-up with Jorge Diaz today regarding his chronic severe urinary symptoms.  To briefly summarize, he is a healthy 68 year old male who has had greater than 6 months of unique urinary symptoms of severe urinary frequency, urgency, and tingling/painful bothersome sensation in the penis and pelvis.  He is undergone extensive work-up including benign cystoscopy, PVR of 0 mL multiple times, CT abdomen/pelvis with contrast that was benign, and urodynamics that was also relatively normal.  During urodynamics, he had significant 10/10 pain in the penis and pelvis with a full bladder.  PSA has been normal at 1.5.  We have tried a number of things so far.  With a prior provider he tried Flomax and an unspecified anticholinergic that reportedly did not improve his pain.  He also has tried Myrbetriq extensively with no improvement.  Most recently, he was given a course of Cipro/Flagyl for his possible diverticulitis, and he also had a course of 2 weeks of Celebrex at the same time and he feels his symptoms improved significantly after taking these medications, but is unsure if it was the antibiotics or the Celebrex.  He does report that after  finishing the Celebrex his urinary symptoms flared up again.   At our last visit, I recommended a trial of Toviaz, however after only a few weeks he had no improvement and requested a trial of a different medication.  At that point we started 25 mg amitriptyline nightly.  He notes a remarkable improvement in his urinary symptoms on the amitriptyline nightly.  For almost 3 weeks on the amitriptyline he had complete resolution of his pain, urgency, and frequency.  He reports over the last week he has had some slight increase in his urgency and frequency, but denies any pain.  I had also placed a referral to pelvic floor physical therapy, but as his symptoms had resolved on amitriptyline he has not seen them yet.  We reviewed his complex presentation again at length.  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  I did recommend that he follow-up with the pelvic floor physical therapist, as many patients will note ongoing improvement in their urgency/frequency, and pelvic pain/discomfort.  Amitriptyline refilled, patient would like to try 50 mg nightly dose Keep follow-up with pelvic floor physical therapy RTC 6 months for symptom check, sooner if problems   I discussed the assessment and treatment plan with the patient. The patient was provided an opportunity to ask questions and all were answered. The patient agreed with the plan and demonstrated an understanding of the instructions.   The patient was advised to call back or seek an in-person evaluation if the symptoms worsen or if the condition fails to improve as anticipated.  I provided 12 minutes of  non-face-to-face time during this encounter.   Billey Co, MD

## 2019-07-02 ENCOUNTER — Encounter: Payer: Self-pay | Admitting: *Deleted

## 2019-07-03 ENCOUNTER — Telehealth: Payer: Self-pay | Admitting: Family Medicine

## 2019-07-03 NOTE — Telephone Encounter (Signed)
Patient notified Amitriptyline has been approved.

## 2020-05-18 ENCOUNTER — Other Ambulatory Visit: Payer: Self-pay | Admitting: Orthopedic Surgery

## 2020-05-18 ENCOUNTER — Other Ambulatory Visit (HOSPITAL_COMMUNITY): Payer: Self-pay | Admitting: Orthopedic Surgery

## 2020-05-18 DIAGNOSIS — M25511 Pain in right shoulder: Secondary | ICD-10-CM

## 2020-05-27 ENCOUNTER — Ambulatory Visit
Admission: RE | Admit: 2020-05-27 | Discharge: 2020-05-27 | Disposition: A | Discharge status: Home / Self Care | Payer: Medicare HMO | Source: Ambulatory Visit | Attending: Orthopedic Surgery | Admitting: Orthopedic Surgery

## 2020-05-27 ENCOUNTER — Other Ambulatory Visit: Payer: Self-pay

## 2020-05-27 DIAGNOSIS — M19011 Primary osteoarthritis, right shoulder: Secondary | ICD-10-CM | POA: Insufficient documentation

## 2020-05-27 DIAGNOSIS — M25511 Pain in right shoulder: Secondary | ICD-10-CM | POA: Diagnosis present

## 2020-05-27 MED ORDER — IOHEXOL 180 MG/ML  SOLN: 15.0000 mL | Freq: Once | INTRAMUSCULAR | Status: DC | PRN

## 2020-05-27 MED ORDER — LIDOCAINE HCL (PF) 1 % IJ SOLN
5.0000 mL | Freq: Once | INTRAMUSCULAR | Status: DC
  Filled 2020-05-27: qty 5

## 2020-05-27 MED ORDER — SODIUM CHLORIDE (PF) 0.9 % IJ SOLN: 5.0000 mL | Freq: Once | INTRAMUSCULAR | Status: DC

## 2020-06-10 ENCOUNTER — Encounter
Admission: RE | Admit: 2020-06-10 | Discharge: 2020-06-10 | Disposition: A | Discharge status: Home / Self Care | Payer: Medicare HMO | Source: Ambulatory Visit | Attending: Orthopedic Surgery | Admitting: Orthopedic Surgery

## 2020-06-10 ENCOUNTER — Other Ambulatory Visit: Payer: Self-pay

## 2020-06-10 ENCOUNTER — Other Ambulatory Visit: Payer: Self-pay | Admitting: Orthopedic Surgery

## 2020-06-10 DIAGNOSIS — Z01812 Encounter for preprocedural laboratory examination: Secondary | ICD-10-CM | POA: Insufficient documentation

## 2020-06-10 HISTORY — DX: Prediabetes: R73.03

## 2020-06-10 HISTORY — DX: Malignant (primary) neoplasm, unspecified: C80.1

## 2020-06-10 HISTORY — DX: Essential (primary) hypertension: I10

## 2020-06-10 NOTE — Patient Instructions (Signed)
Your procedure is scheduled on:06-14-20 TUESDAY Report to the Registration Desk on the 1st floor of the [facility]. To find out your arrival time, please call [phone] between 1PM - 3PM on:06-13-20 MONDAY  REMEMBER: Instructions that are not followed completely may result in serious medical risk, up to and including death; or upon the discretion of your surgeon and anesthesiologist your surgery may need to be rescheduled.  Do not eat food after midnight the night before surgery.  No gum chewing, lozengers or hard candies.  You may however, drink CLEAR liquids up to 2 hours before you are scheduled to arrive for your surgery. Do not drink anything within 2 hours of your scheduled arrival time.  Clear liquids include: - water  - apple juice without pulp - gatorade (not RED, PURPLE, OR BLUE) - black coffee or tea (Do NOT add milk or creamers to the coffee or tea) Do NOT drink anything that is not on this list.  TAKE THESE MEDICATIONS THE MORNING OF SURGERY WITH A SIP OF WATER: -ALLOPURINOL (ZYLOPRIM) -COREG (CARVEDILOL) -PRILOSEC (OMEPRAZOLE)-take one the night before and one on the morning of surgery - helps to prevent nausea after surgery.)  Follow recommendations from Cardiologist, Pulmonologist or PCP regarding stopping Aspirin, Coumadin, Plavix, Eliquis, Pradaxa, or Pletal-ASPIRIN WAS STOPPED THE WEEK OF 05-30-20  One week prior to surgery: Stop Anti-inflammatories (NSAIDS) such as Advil, Aleve, Ibuprofen, Motrin, Naproxen, Naprosyn and Aspirin based products such as Excedrin, Goodys Powder, BC Powder-OK TO TAKE TYLENOL IF NEEDED  Stop ANY OVER THE COUNTER supplements until after surgery. (However, you may continue taking your multivitamin up until the day before surgery.)  No Alcohol for 24 hours before or after surgery.  No Smoking including e-cigarettes for 24 hours prior to surgery.  No chewable tobacco products for at least 6 hours prior to surgery.  No nicotine  patches on the day of surgery.  Do not use any "recreational" drugs for at least a week prior to your surgery.  Please be advised that the combination of cocaine and anesthesia may have negative outcomes, up to and including death. If you test positive for cocaine, your surgery will be cancelled.  On the morning of surgery brush your teeth with toothpaste and water, you may rinse your mouth with mouthwash if you wish. Do not swallow any toothpaste or mouthwash.  Do not wear jewelry, make-up, hairpins, clips or nail polish.  Do not wear lotions, powders, or perfumes.   Do not shave body from the neck down 48 hours prior to surgery just in case you cut yourself which could leave a site for infection.  Also, freshly shaved skin may become irritated if using the CHG soap.  Contact lenses, hearing aids and dentures may not be worn into surgery.  Do not bring valuables to the hospital. [facility] is not responsible for any missing/lost belongings or valuables.   Use CHG Soap as directed on instruction sheet.  Notify your doctor if there is any change in your medical condition (cold, fever, infection).  Wear comfortable clothing (specific to your surgery type) to the hospital.  Plan for stool softeners for home use; pain medications have a tendency to cause constipation. You can also help prevent constipation by eating foods high in fiber such as fruits and vegetables and drinking plenty of fluids as your diet allows.  After surgery, you can help prevent lung complications by doing breathing exercises.  Take deep breaths and cough every 1-2 hours. Your doctor may  order a device called an Incentive Spirometer to help you take deep breaths. When coughing or sneezing, hold a pillow firmly against your incision with both hands. This is called "splinting." Doing this helps protect your incision. It also decreases belly discomfort.  If you are being admitted to the hospital overnight, leave your  suitcase in the car. After surgery it may be brought to your room.  If you are being discharged the day of surgery, you will not be allowed to drive home. You will need a responsible adult (18 years or older) to drive you home and stay with you that night.   If you are taking public transportation, you will need to have a responsible adult (18 years or older) with you. Please confirm with your physician that it is acceptable to use public transportation.   Please call the [facility] at [phone] if you have any questions about these instructions.  Visitation Policy:  Patients undergoing a surgery or procedure may have one family member or support person with them as long as that person is not COVID-19 positive or experiencing its symptoms.  That person may remain in the waiting area during the procedure.  Inpatient Visitation Update:   In an effort to ensure the safety of our team members and our patients, we are implementing a change to our visitation policy:  Effective Monday, Aug. 9, at 7 a.m., inpatients will be allowed one support person.  o The support person may change daily.  o The support person must pass our screening, gel in and out, and wear a mask at all times, including in the patient's room.  o Patients must also wear a mask when staff or their support person are in the room.  o Masking is required regardless of vaccination status.  Systemwide, no visitors 17 or younger.

## 2020-06-13 ENCOUNTER — Other Ambulatory Visit: Payer: Self-pay

## 2020-06-13 ENCOUNTER — Other Ambulatory Visit
Admission: RE | Admit: 2020-06-13 | Discharge: 2020-06-13 | Disposition: A | Discharge status: Home / Self Care | Payer: Medicare HMO | Source: Ambulatory Visit | Attending: Orthopedic Surgery | Admitting: Orthopedic Surgery

## 2020-06-13 DIAGNOSIS — Z20822 Contact with and (suspected) exposure to covid-19: Secondary | ICD-10-CM | POA: Diagnosis not present

## 2020-06-13 DIAGNOSIS — Z01812 Encounter for preprocedural laboratory examination: Secondary | ICD-10-CM | POA: Insufficient documentation

## 2020-06-13 LAB — CBC WITH DIFFERENTIAL/PLATELET
Abs Immature Granulocytes: 0.03 10*3/uL (ref 0.00–0.07)
Basophils Absolute: 0.1 10*3/uL (ref 0.0–0.1)
Basophils Relative: 2 %
Eosinophils Absolute: 0.2 10*3/uL (ref 0.0–0.5)
Eosinophils Relative: 3 %
HCT: 50.1 % (ref 39.0–52.0)
Hemoglobin: 17 g/dL (ref 13.0–17.0)
Immature Granulocytes: 1 %
Lymphocytes Relative: 31 %
Lymphs Abs: 1.9 10*3/uL (ref 0.7–4.0)
MCH: 30.2 pg (ref 26.0–34.0)
MCHC: 33.9 g/dL (ref 30.0–36.0)
MCV: 89 fL (ref 80.0–100.0)
Monocytes Absolute: 0.5 10*3/uL (ref 0.1–1.0)
Monocytes Relative: 7 %
Neutro Abs: 3.5 10*3/uL (ref 1.7–7.7)
Neutrophils Relative %: 56 %
Platelets: 231 10*3/uL (ref 150–400)
RBC: 5.63 MIL/uL (ref 4.22–5.81)
RDW: 13.3 % (ref 11.5–15.5)
WBC: 6.1 10*3/uL (ref 4.0–10.5)
nRBC: 0 % (ref 0.0–0.2)

## 2020-06-13 LAB — BASIC METABOLIC PANEL
Anion gap: 11 (ref 5–15)
BUN: 19 mg/dL (ref 8–23)
CO2: 24 mmol/L (ref 22–32)
Calcium: 9.4 mg/dL (ref 8.9–10.3)
Chloride: 104 mmol/L (ref 98–111)
Creatinine, Ser: 1.1 mg/dL (ref 0.61–1.24)
GFR, Estimated: >60 mL/min (ref >60)
Glucose, Bld: 136 mg/dL — ABNORMAL HIGH (ref 70–99)
Potassium: 4.2 mmol/L (ref 3.5–5.1)
Sodium: 139 mmol/L (ref 135–145)

## 2020-06-13 LAB — APTT: aPTT: 32 seconds (ref 24–36)

## 2020-06-13 LAB — SARS CORONAVIRUS 2 (TAT 6-24 HRS): SARS Coronavirus 2: NEGATIVE

## 2020-06-13 LAB — PROTIME-INR
INR: 1.1 (ref 0.8–1.2)
Prothrombin Time: 13.5 seconds (ref 11.4–15.2)

## 2020-06-14 ENCOUNTER — Ambulatory Visit: Payer: Medicare HMO | Admitting: Anesthesiology

## 2020-06-14 ENCOUNTER — Other Ambulatory Visit: Payer: Self-pay

## 2020-06-14 ENCOUNTER — Ambulatory Visit
Admission: RE | Admit: 2020-06-14 | Discharge: 2020-06-14 | Disposition: A | Discharge status: Home / Self Care | Payer: Medicare HMO | Attending: Orthopedic Surgery | Admitting: Orthopedic Surgery

## 2020-06-14 ENCOUNTER — Ambulatory Visit: Payer: Medicare HMO

## 2020-06-14 ENCOUNTER — Encounter
Admission: RE | Disposition: A | Discharge status: Home / Self Care | Payer: Self-pay | Source: Home / Self Care | Attending: Orthopedic Surgery

## 2020-06-14 DIAGNOSIS — Z419 Encounter for procedure for purposes other than remedying health state, unspecified: Secondary | ICD-10-CM

## 2020-06-14 DIAGNOSIS — Z79899 Other long term (current) drug therapy: Secondary | ICD-10-CM | POA: Insufficient documentation

## 2020-06-14 DIAGNOSIS — M75121 Complete rotator cuff tear or rupture of right shoulder, not specified as traumatic: Secondary | ICD-10-CM | POA: Diagnosis not present

## 2020-06-14 DIAGNOSIS — Z7982 Long term (current) use of aspirin: Secondary | ICD-10-CM | POA: Diagnosis not present

## 2020-06-14 DIAGNOSIS — G8918 Other acute postprocedural pain: Secondary | ICD-10-CM

## 2020-06-14 HISTORY — PX: SHOULDER ARTHROSCOPY WITH OPEN ROTATOR CUFF REPAIR AND DISTAL CLAVICLE ACROMINECTOMY: SHX5683

## 2020-06-14 LAB — GLUCOSE, CAPILLARY: Glucose-Capillary: 124 mg/dL — ABNORMAL HIGH (ref 70–99)

## 2020-06-14 SURGERY — SHOULDER ARTHROSCOPY WITH OPEN ROTATOR CUFF REPAIR AND DISTAL CLAVICLE ACROMINECTOMY
Anesthesia: General | Laterality: Right

## 2020-06-14 MED ORDER — MIDAZOLAM HCL 2 MG/2ML IJ SOLN
INTRAMUSCULAR | Status: DC | PRN
  Administered 2020-06-14: 2 mg via INTRAVENOUS

## 2020-06-14 MED ORDER — ONDANSETRON HCL 4 MG/2ML IJ SOLN: 4.0000 mg | Freq: Once | INTRAMUSCULAR | Status: DC | PRN

## 2020-06-14 MED ORDER — DEXAMETHASONE SODIUM PHOSPHATE 10 MG/ML IJ SOLN
INTRAMUSCULAR | Status: DC | PRN
  Administered 2020-06-14: 5 mg via INTRAVENOUS

## 2020-06-14 MED ORDER — CEFAZOLIN SODIUM-DEXTROSE 2-4 GM/100ML-% IV SOLN
2.0000 g | INTRAVENOUS | Status: AC
  Administered 2020-06-14: 2 g via INTRAVENOUS

## 2020-06-14 MED ORDER — CHLORHEXIDINE GLUCONATE 0.12 % MT SOLN
OROMUCOSAL | Status: AC
  Administered 2020-06-14: 15 mL via OROMUCOSAL
  Filled 2020-06-14: qty 15

## 2020-06-14 MED ORDER — CHLORHEXIDINE GLUCONATE 0.12 % MT SOLN: 15.0000 mL | Freq: Once | OROMUCOSAL | Status: AC

## 2020-06-14 MED ORDER — CHLORHEXIDINE GLUCONATE CLOTH 2 % EX PADS: 6.0000 | MEDICATED_PAD | Freq: Once | CUTANEOUS | Status: DC

## 2020-06-14 MED ORDER — SUGAMMADEX SODIUM 200 MG/2ML IV SOLN
INTRAVENOUS | Status: DC | PRN
  Administered 2020-06-14: 200 mg via INTRAVENOUS

## 2020-06-14 MED ORDER — MIDAZOLAM HCL 2 MG/2ML IJ SOLN: 2.0000 mg | Freq: Once | INTRAMUSCULAR | Status: AC

## 2020-06-14 MED ORDER — ROCURONIUM BROMIDE 10 MG/ML (PF) SYRINGE
PREFILLED_SYRINGE | INTRAVENOUS | Status: AC
  Filled 2020-06-14: qty 10

## 2020-06-14 MED ORDER — EPINEPHRINE PF 1 MG/ML IJ SOLN
INTRAMUSCULAR | Status: AC
  Filled 2020-06-14: qty 2

## 2020-06-14 MED ORDER — BUPIVACAINE HCL 0.25 % IJ SOLN
INTRAMUSCULAR | Status: DC | PRN
  Administered 2020-06-14: 30 mL

## 2020-06-14 MED ORDER — CEFAZOLIN SODIUM-DEXTROSE 2-4 GM/100ML-% IV SOLN
INTRAVENOUS | Status: AC
  Filled 2020-06-14: qty 100

## 2020-06-14 MED ORDER — ACETAMINOPHEN 10 MG/ML IV SOLN
INTRAVENOUS | Status: AC
  Filled 2020-06-14: qty 100

## 2020-06-14 MED ORDER — EPHEDRINE 5 MG/ML INJ
INTRAVENOUS | Status: AC
  Filled 2020-06-14: qty 10

## 2020-06-14 MED ORDER — LIDOCAINE HCL (CARDIAC) PF 100 MG/5ML IV SOSY
PREFILLED_SYRINGE | INTRAVENOUS | Status: DC | PRN
  Administered 2020-06-14: 60 mg via INTRAVENOUS

## 2020-06-14 MED ORDER — CARVEDILOL 12.5 MG PO TABS
ORAL_TABLET | ORAL | Status: AC
  Administered 2020-06-14: 6.25 mg via ORAL
  Filled 2020-06-14: qty 1

## 2020-06-14 MED ORDER — CARVEDILOL 12.5 MG PO TABS: 6.2500 mg | ORAL_TABLET | Freq: Once | ORAL | Status: AC

## 2020-06-14 MED ORDER — ONDANSETRON HCL 4 MG PO TABS: 4.0000 mg | ORAL_TABLET | Freq: Three times a day (TID) | ORAL | 0 refills | Status: DC | PRN

## 2020-06-14 MED ORDER — FENTANYL CITRATE (PF) 100 MCG/2ML IJ SOLN: 100.0000 ug | Freq: Once | INTRAMUSCULAR | Status: AC

## 2020-06-14 MED ORDER — ACETAMINOPHEN 10 MG/ML IV SOLN
INTRAVENOUS | Status: DC | PRN
  Administered 2020-06-14: 1000 mg via INTRAVENOUS

## 2020-06-14 MED ORDER — EPINEPHRINE PF 1 MG/ML IJ SOLN
INTRAMUSCULAR | Status: AC
  Filled 2020-06-14: qty 1

## 2020-06-14 MED ORDER — ROCURONIUM BROMIDE 100 MG/10ML IV SOLN
INTRAVENOUS | Status: DC | PRN
  Administered 2020-06-14: 30 mg via INTRAVENOUS
  Administered 2020-06-14: 20 mg via INTRAVENOUS

## 2020-06-14 MED ORDER — LIDOCAINE HCL (PF) 1 % IJ SOLN
INTRAMUSCULAR | Status: DC | PRN
  Administered 2020-06-14: .8 mL via SUBCUTANEOUS

## 2020-06-14 MED ORDER — PHENYLEPHRINE HCL (PRESSORS) 10 MG/ML IV SOLN
INTRAVENOUS | Status: DC | PRN
  Administered 2020-06-14 (×2): 100 ug via INTRAVENOUS

## 2020-06-14 MED ORDER — ROPIVACAINE HCL 5 MG/ML IJ SOLN
INTRAMUSCULAR | Status: DC | PRN
  Administered 2020-06-14: 30 mL via PERINEURAL

## 2020-06-14 MED ORDER — ORAL CARE MOUTH RINSE: 15.0000 mL | Freq: Once | OROMUCOSAL | Status: AC

## 2020-06-14 MED ORDER — EPHEDRINE SULFATE 50 MG/ML IJ SOLN
INTRAMUSCULAR | Status: DC | PRN
  Administered 2020-06-14: 10 mg via INTRAVENOUS
  Administered 2020-06-14 (×2): 15 mg via INTRAVENOUS
  Administered 2020-06-14: 10 mg via INTRAVENOUS
  Administered 2020-06-14: 20 mg via INTRAVENOUS
  Administered 2020-06-14: 5 mg via INTRAVENOUS

## 2020-06-14 MED ORDER — ONDANSETRON HCL 4 MG/2ML IJ SOLN
INTRAMUSCULAR | Status: DC | PRN
Start: 2020-06-14 — End: 2020-06-14
  Administered 2020-06-14: 4 mg via INTRAVENOUS

## 2020-06-14 MED ORDER — DEXAMETHASONE SODIUM PHOSPHATE 10 MG/ML IJ SOLN
INTRAMUSCULAR | Status: AC
  Filled 2020-06-14: qty 1

## 2020-06-14 MED ORDER — NEOMYCIN-POLYMYXIN B GU 40-200000 IR SOLN
Status: AC
  Filled 2020-06-14: qty 2

## 2020-06-14 MED ORDER — OXYCODONE HCL 5 MG PO TABS
5.0000 mg | ORAL_TABLET | ORAL | 0 refills | Status: DC | PRN
Start: 2020-06-14 — End: 2023-07-29

## 2020-06-14 MED ORDER — PROPOFOL 10 MG/ML IV BOLUS
INTRAVENOUS | Status: AC
  Filled 2020-06-14: qty 20

## 2020-06-14 MED ORDER — FENTANYL CITRATE (PF) 100 MCG/2ML IJ SOLN
INTRAMUSCULAR | Status: AC
  Administered 2020-06-14: 50 ug via INTRAVENOUS
  Filled 2020-06-14: qty 2

## 2020-06-14 MED ORDER — LACTATED RINGERS IV SOLN: INTRAVENOUS | Status: DC

## 2020-06-14 MED ORDER — PROPOFOL 10 MG/ML IV BOLUS
INTRAVENOUS | Status: DC | PRN
  Administered 2020-06-14: 150 mg via INTRAVENOUS

## 2020-06-14 MED ORDER — FENTANYL CITRATE (PF) 100 MCG/2ML IJ SOLN
INTRAMUSCULAR | Status: AC
  Filled 2020-06-14: qty 2

## 2020-06-14 MED ORDER — FENTANYL CITRATE (PF) 100 MCG/2ML IJ SOLN
INTRAMUSCULAR | Status: DC | PRN
  Administered 2020-06-14 (×2): 25 ug via INTRAVENOUS
  Administered 2020-06-14: 50 ug via INTRAVENOUS

## 2020-06-14 MED ORDER — ONDANSETRON HCL 4 MG/2ML IJ SOLN
INTRAMUSCULAR | Status: AC
  Filled 2020-06-14: qty 2

## 2020-06-14 MED ORDER — LACTATED RINGERS IV SOLN
INTRAVENOUS | Status: DC | PRN
  Administered 2020-06-14: 2 mL

## 2020-06-14 MED ORDER — MIDAZOLAM HCL 2 MG/2ML IJ SOLN
INTRAMUSCULAR | Status: AC
  Filled 2020-06-14: qty 2

## 2020-06-14 MED ORDER — MIDAZOLAM HCL 2 MG/2ML IJ SOLN
INTRAMUSCULAR | Status: AC
  Administered 2020-06-14: 1 mg via INTRAVENOUS
  Filled 2020-06-14: qty 2

## 2020-06-14 MED ORDER — ROPIVACAINE HCL 5 MG/ML IJ SOLN
INTRAMUSCULAR | Status: AC
  Filled 2020-06-14: qty 30

## 2020-06-14 MED ORDER — LIDOCAINE HCL (PF) 1 % IJ SOLN
INTRAMUSCULAR | Status: AC
  Filled 2020-06-14: qty 5

## 2020-06-14 MED ORDER — LIDOCAINE HCL (PF) 2 % IJ SOLN
INTRAMUSCULAR | Status: AC
  Filled 2020-06-14: qty 5

## 2020-06-14 MED ORDER — SODIUM CHLORIDE 0.9 % IV SOLN
INTRAVENOUS | Status: DC | PRN
  Administered 2020-06-14: 30 ug/min via INTRAVENOUS

## 2020-06-14 MED ORDER — NEOMYCIN-POLYMYXIN B GU 40-200000 IR SOLN
Status: DC | PRN
  Administered 2020-06-14: 2 mL

## 2020-06-14 MED ORDER — FENTANYL CITRATE (PF) 100 MCG/2ML IJ SOLN: 25.0000 ug | INTRAMUSCULAR | Status: DC | PRN

## 2020-06-14 MED ORDER — LIDOCAINE HCL (PF) 1 % IJ SOLN
INTRAMUSCULAR | Status: AC
  Filled 2020-06-14: qty 30

## 2020-06-14 SURGICAL SUPPLY — 78 items
ADAPTER IRRIG TUBE 2 SPIKE SOL (ADAPTER) ×6 IMPLANT
ADPR TBG 2 SPK PMP STRL ASCP (ADAPTER) ×2
ANCH SUT 5.5 KNTLS PEEK (Orthopedic Implant) ×2 IMPLANT
ANCH SUT Q-FX 2.8 (Anchor) ×2 IMPLANT
ANCHOR ALL-SUT Q-FIX 2.8 (Anchor) ×6 IMPLANT
ANCHOR SUT 5.5 MULTIFIX (Orthopedic Implant) ×4 IMPLANT
ANCHOR SUT 5.5MM MULTIFIX (Orthopedic Implant) ×2 IMPLANT
BUR RADIUS 4.0X18.5 (BURR) ×3 IMPLANT
BUR RADIUS 5.5 (BURR) ×3 IMPLANT
CANISTER SUCT LVC 12 LTR MEDI- (MISCELLANEOUS) ×3 IMPLANT
CANNULA 5.75X7 CRYSTAL CLEAR (CANNULA) ×6 IMPLANT
CANNULA PARTIAL THREAD 2X7 (CANNULA) ×3 IMPLANT
CANNULA TWIST IN 8.25X9CM (CANNULA) ×6 IMPLANT
CLOSURE WOUND 1/2 X4 (GAUZE/BANDAGES/DRESSINGS) ×1
CONNECTOR PERFECT PASSER (CONNECTOR) ×9 IMPLANT
COOLER POLAR GLACIER W/PUMP (MISCELLANEOUS) ×3 IMPLANT
COVER WAND RF STERILE (DRAPES) ×3 IMPLANT
DEVICE SUCT BLK HOLE OR FLOOR (MISCELLANEOUS) IMPLANT
DRAPE 3/4 80X56 (DRAPES) ×3 IMPLANT
DRAPE IMP U-DRAPE 54X76 (DRAPES) ×6 IMPLANT
DRAPE INCISE IOBAN 66X45 STRL (DRAPES) ×3 IMPLANT
DRAPE INCISE IOBAN 66X60 STRL (DRAPES) ×3 IMPLANT
DRAPE U-SHAPE 47X51 STRL (DRAPES) ×3 IMPLANT
DURAPREP 26ML APPLICATOR (WOUND CARE) ×9 IMPLANT
ELECT REM PT RETURN 9FT ADLT (ELECTROSURGICAL) ×3
ELECTRODE REM PT RTRN 9FT ADLT (ELECTROSURGICAL) ×1 IMPLANT
GAUZE SPONGE 4X4 12PLY STRL (GAUZE/BANDAGES/DRESSINGS) ×3 IMPLANT
GAUZE XEROFORM 1X8 LF (GAUZE/BANDAGES/DRESSINGS) ×3 IMPLANT
GLOVE BIOGEL PI IND STRL 9 (GLOVE) ×1 IMPLANT
GLOVE BIOGEL PI INDICATOR 9 (GLOVE) ×2
GLOVE SURG 9.0 ORTHO LTXF (GLOVE) ×9 IMPLANT
GOWN STRL REUS TWL 2XL XL LVL4 (GOWN DISPOSABLE) ×3 IMPLANT
GOWN STRL REUS W/ TWL LRG LVL3 (GOWN DISPOSABLE) ×1 IMPLANT
GOWN STRL REUS W/TWL LRG LVL3 (GOWN DISPOSABLE) ×3
IV LACTATED RINGER IRRG 3000ML (IV SOLUTION) ×30
IV LR IRRIG 3000ML ARTHROMATIC (IV SOLUTION) ×10 IMPLANT
KIT STABILIZATION SHOULDER (MISCELLANEOUS) ×3 IMPLANT
KIT SUTURE 2.8 Q-FIX DISP (MISCELLANEOUS) ×3 IMPLANT
KIT SUTURETAK 3.0 INSERT PERC (KITS) IMPLANT
KIT TURNOVER KIT A (KITS) ×3 IMPLANT
MANIFOLD NEPTUNE II (INSTRUMENTS) ×6 IMPLANT
MASK FACE SPIDER DISP (MASK) ×3 IMPLANT
MAT ABSORB  FLUID 56X50 GRAY (MISCELLANEOUS) ×4
MAT ABSORB FLUID 56X50 GRAY (MISCELLANEOUS) ×2 IMPLANT
NDL SAFETY ECLIPSE 18X1.5 (NEEDLE) ×1 IMPLANT
NEEDLE HYPO 18GX1.5 SHARP (NEEDLE) ×3
NEEDLE HYPO 22GX1.5 SAFETY (NEEDLE) ×3 IMPLANT
NS IRRIG 500ML POUR BTL (IV SOLUTION) ×3 IMPLANT
PACK ARTHROSCOPY SHOULDER (MISCELLANEOUS) ×3 IMPLANT
PAD ARMBOARD 7.5X6 YLW CONV (MISCELLANEOUS) ×6 IMPLANT
PAD WRAPON POLAR SHDR XLG (MISCELLANEOUS) ×1 IMPLANT
PASSER SUT CAPTURE FIRST (INSTRUMENTS) IMPLANT
PASSER SUT FIRSTPASS SELF (INSTRUMENTS) ×3 IMPLANT
SET TUBE SUCT SHAVER OUTFL 24K (TUBING) ×3 IMPLANT
SET TUBE TIP INTRA-ARTICULAR (MISCELLANEOUS) ×3 IMPLANT
STRIP CLOSURE SKIN 1/2X4 (GAUZE/BANDAGES/DRESSINGS) ×2 IMPLANT
SUT ETHILON 4-0 (SUTURE) ×3
SUT ETHILON 4-0 FS2 18XMFL BLK (SUTURE) ×1
SUT LASSO 90 DEG SD STR (SUTURE) IMPLANT
SUT MNCRL 4-0 (SUTURE) ×3
SUT MNCRL 4-0 27XMFL (SUTURE) ×1
SUT PDS AB 0 CT1 27 (SUTURE) ×9 IMPLANT
SUT PERFECTPASSER WHITE CART (SUTURE) ×18 IMPLANT
SUT SMART STITCH CARTRIDGE (SUTURE) ×12 IMPLANT
SUT ULTRABRAID 2 COBRAID 38 (SUTURE) ×3 IMPLANT
SUT VIC AB 0 CT1 36 (SUTURE) ×9 IMPLANT
SUT VIC AB 2-0 CT2 27 (SUTURE) ×3 IMPLANT
SUTURE ETHLN 4-0 FS2 18XMF BLK (SUTURE) ×1 IMPLANT
SUTURE MAGNUM WIRE 2X48 BLK (SUTURE) IMPLANT
SUTURE MNCRL 4-0 27XMF (SUTURE) ×1 IMPLANT
SYR 10ML LL (SYRINGE) ×3 IMPLANT
SYR BULB IRRIG 60ML STRL (SYRINGE) ×3 IMPLANT
TAPE MICROFOAM 4IN (TAPE) ×3 IMPLANT
TUBING ARTHRO INFLOW-ONLY STRL (TUBING) ×3 IMPLANT
TUBING CONNECTING 10 (TUBING) ×2 IMPLANT
TUBING CONNECTING 10' (TUBING) ×1
WAND HAND CNTRL MULTIVAC 90 (MISCELLANEOUS) ×3 IMPLANT
WRAPON POLAR PAD SHDR XLG (MISCELLANEOUS) ×3

## 2020-06-14 NOTE — Discharge Instructions (Addendum)
AMBULATORY SURGERY  DISCHARGE INSTRUCTIONS   1) The drugs that you were given will stay in your system until tomorrow so for the next 24 hours you should not:  A) Drive an automobile B) Make any legal decisions C) Drink any alcoholic beverage   2) You may resume regular meals tomorrow.  Today it is better to start with liquids and gradually work up to solid foods.   You may eat anything you prefer, but it is better to start with liquids, then soup and crackers, and gradually work up to solid foods.   3) Please notify your doctor immediately if you have any unusual bleeding, trouble breathing, redness and pain at the surgery site, drainage, fever, or pain not relieved by medication.    4) Additional Instructions:        Please contact your physician with any problems or Same Day Surgery at [phone], Monday through Friday 6 am to 4 pm, or [place] at [facility] number at [phone].   Interscalene Nerve Block (ISNB) Discharge Instructions   1.  For your surgery you have received an Interscalene Nerve Block. 2. Nerve Blocks affect many types of nerves, including nerves that control movement, pain and normal sensation.  You may experience feelings such as numbness, tingling, heaviness, weakness or the inability to move your arm or the feeling or sensation that your arm has "fallen asleep". 3. A nerve block can last for 2 - 36 hours or more depending on the medication used.  Usually the weakness wears off first.  The tingling and heaviness usually wear off next.  Finally you may start to notice pain.  Keep in mind that this may occur in any order.  once a nerve block starts to wear off it is usually completely gone within 60 minutes. 4. ISNB may cause mild shortness of breath, a hoarse voice, blurry vision, unequal pupils, or drooping of the face on the same side as the nerve block.  These symptoms will usually go away within 12 hours.  Very rarely the procedure itself can  cause mild seizures. 5. If needed, your surgeon will give you a prescription for pain medication.  It will take about 60 minutes for the oral pain medication to become fully effective.  So, it is recommended that you start taking this medication before the nerve block first begins to wear off, or when you first begin to feel discomfort. 6. Keep in mind that nerve blocks often wear off in the middle of the night.   If you are going to bed and the block has not started to wear off or you have not started to have any discomfort, consider setting an alarm for 2 to 3 hours, so you can assess your block.  If you notice the block is wearing off or you are starting to have discomfort, you can take your pain medication. 7. Take your pain medication only as prescribed.  Pain medication can cause sedation and decrease your breathing if you take more than you need for the level of pain that you have. 8. Nausea is a common side effect of many pain medications.  You may want to eat something before taking your pain medicine to prevent nausea. 9. After an Interscalene nerve block, you cannot feel pain, pressure or extremes in temperature in the effected arm.  Because your arm is numb it is at an increased risk for injury.  To decrease the possibility of injury, please practice the following:  a. While  you are awake change the position of your arm frequently to prevent too much pressure on any one area for prolonged periods of time. b.  If you have a cast or tight dressing, check the color or your fingers every couple of hours.  Call your surgeon with the appearance of any discoloration (white or blue). c. If you are given a sling to wear before you go home, please wear it  at all times until the block has completely worn off.  Do not get up at night without your sling. d. If you experience any problems or concerns, please contact your surgeon's office. e. If you experience severe or prolonged shortness of breath go to  the nearest emergency department.

## 2020-06-14 NOTE — Anesthesia Postprocedure Evaluation (Signed)
Anesthesia Post Note  Patient: Jorge Saleeby Nan Sr.  Procedure(s) Performed: RIGHT SHOULDER ARTHROSCOPY SUBACCROMINAL DECOMPRESSION, DISTAL CLAVICLE EXCISION AND MINI-OPEN ROTATOR CUFF REPAIR (Right )  Patient location during evaluation: PACU Anesthesia Type: General Level of consciousness: awake and alert Pain management: pain level controlled Vital Signs Assessment: post-procedure vital signs reviewed and stable Respiratory status: spontaneous breathing and respiratory function stable Cardiovascular status: stable Anesthetic complications: no   No complications documented.   Last Vitals:  Vitals:   06/14/20 1555 06/14/20 1558  BP: 119/83   Pulse: 91   Resp: 20   Temp:    SpO2:  (!) 88%    Last Pain:  Vitals:   06/14/20 1542  TempSrc:   PainSc: Asleep                 Marynell Bies K

## 2020-06-14 NOTE — Anesthesia Preprocedure Evaluation (Addendum)
Anesthesia Evaluation  Patient identified by MRN, date of birth, ID band Patient awake    Reviewed: Allergy & Precautions, NPO status , Patient's Chart, lab work & pertinent test results  History of Anesthesia Complications Negative for: history of anesthetic complications  Airway Mallampati: III       Dental   Pulmonary sleep apnea (does not tolerate CPAP) , neg COPD, Not current smoker,           Cardiovascular hypertension, Pt. on medications (-) Past MI and (-) CHF (-) dysrhythmias (-) Valvular Problems/Murmurs     Neuro/Psych    GI/Hepatic Neg liver ROS, GERD  Medicated and Controlled,  Endo/Other  neg diabetes  Renal/GU negative Renal ROS     Musculoskeletal   Abdominal   Peds  Hematology   Anesthesia Other Findings   Reproductive/Obstetrics                             Anesthesia Physical Anesthesia Plan  ASA: II  Anesthesia Plan: General   Post-op Pain Management: GA combined w/ Regional for post-op pain   Induction: Intravenous  PONV Risk Score and Plan: 2 and Dexamethasone and Ondansetron  Airway Management Planned: Oral ETT  Additional Equipment:   Intra-op Plan:   Post-operative Plan:   Informed Consent: I have reviewed the patients History and Physical, chart, labs and discussed the procedure including the risks, benefits and alternatives for the proposed anesthesia with the patient or authorized representative who has indicated his/her understanding and acceptance.       Plan Discussed with:   Anesthesia Plan Comments:         Anesthesia Quick Evaluation

## 2020-06-14 NOTE — Anesthesia Procedure Notes (Signed)
Anesthesia Regional Block: Interscalene brachial plexus block   Pre-Anesthetic Checklist: ,, timeout performed, Correct Patient, Correct Site, Correct Laterality, Correct Procedure, Correct Position, site marked, Risks and benefits discussed,  Surgical consent,  Pre-op evaluation,  At surgeon's request and post-op pain management  Laterality: Right  Prep: chloraprep       Needles:  Injection technique: Single-shot  Needle Type: Echogenic Stimulator Needle     Needle Length: 10cm  Needle Gauge: 22     Additional Needles:   Procedures:,,,, ultrasound used (permanent image in chart),,,,   Nerve Stimulator or Paresthesia:  Response: biceps, 0.8 mA,   Additional Responses:   Narrative:  Start time: 06/14/2020 10:43 AM End time: 06/14/2020 10:46 AM  Performed by: Personally  Anesthesiologist: Molli Barrows, MD  Additional Notes: Pt. Identified and accepting of procedure after risks and benefits fully reviewed and questions answered. Time out performed and laterality confirmed prior to procedure.  ISNB  performed without difficulty and well tolerated.  Neg IV and SATD.  No pain on injection of Local anesthetic and VSST.

## 2020-06-14 NOTE — Transfer of Care (Signed)
Immediate Anesthesia Transfer of Care Note  Patient: Jorge Wickliff Wilemon Sr.  Procedure(s) Performed: RIGHT SHOULDER ARTHROSCOPY SUBACCROMINAL DECOMPRESSION, DISTAL CLAVICLE EXCISION AND MINI-OPEN ROTATOR CUFF REPAIR (Right )  Patient Location: PACU  Anesthesia Type:General  Level of Consciousness: drowsy and patient cooperative  Airway & Oxygen Therapy: Patient Spontanous Breathing and Patient connected to face mask oxygen  Post-op Assessment: Report given to RN and Post -op Vital signs reviewed and stable  Post vital signs: Reviewed and stable  Last Vitals:  Vitals Value Taken Time  BP 99/71 06/14/20 1542  Temp    Pulse 88 06/14/20 1542  Resp 22 06/14/20 1542  SpO2 97 % 06/14/20 1542  Vitals shown include unvalidated device data.  Last Pain:  Vitals:   06/14/20 1055  TempSrc:   PainSc: 0-No pain         Complications: No complications documented.

## 2020-06-14 NOTE — H&P (Signed)
PREOPERATIVE H&P  Chief Complaint: right shoulder complete rotator cuff tear  HPI: Jorge Remer Docter Sr. is a 69 y.o. male who presents for preoperative history and physical with a diagnosis of right shoulder complete rotator cuff tear. Symptoms of pain, weakness and amended range of motion are significantly impairing activities of daily living.  MRI has confirmed a full-thickness and retracted tear of the rotator cuff  He has agreed with surgical management.   Past Medical History:  Diagnosis Date  . BPH (benign prostatic hyperplasia)   . Cancer (HCC)    BASAL CELL/MELANOMA  . Dermatitis herpetiformis   . Diverticulosis   . Erectile dysfunction   . Erythrocytosis   . Esophagitis   . Gastritis   . Gout   . Hyperglycemia   . Hyperlipidemia   . Hypertension   . Inappropriate sinus node tachycardia   . Internal hemorrhoids   . Osteoarthritis   . Polycythemia   . Pre-diabetes   . Prediabetes   . Right rotator cuff tear   . Sleep apnea    DOES NOT USE CPAP   Past Surgical History:  Procedure Laterality Date  . COLONOSCOPY WITH ESOPHAGOGASTRODUODENOSCOPY (EGD)  03/28/2007   Social History   Socioeconomic History  . Marital status: Married    Spouse name: Not on file  . Number of children: Not on file  . Years of education: Not on file  . Highest education level: Not on file  Occupational History  . Not on file  Tobacco Use  . Smoking status: Never Smoker  . Smokeless tobacco: Never Used  Vaping Use  . Vaping Use: Never used  Substance and Sexual Activity  . Alcohol use: Yes    Comment: Drinks 6 or 8 beers on the weekend  . Drug use: No  . Sexual activity: Yes    Birth control/protection: None  Other Topics Concern  . Not on file  Social History Narrative  . Not on file   Social Determinants of Health   Financial Resource Strain:   . Difficulty of Paying Living Expenses: Not on file  Food Insecurity:   . Worried About Charity fundraiser in the Last Year: Not on  file  . Ran Out of Food in the Last Year: Not on file  Transportation Needs:   . Lack of Transportation (Medical): Not on file  . Lack of Transportation (Non-Medical): Not on file  Physical Activity:   . Days of Exercise per Week: Not on file  . Minutes of Exercise per Session: Not on file  Stress:   . Feeling of Stress : Not on file  Social Connections:   . Frequency of Communication with Friends and Family: Not on file  . Frequency of Social Gatherings with Friends and Family: Not on file  . Attends Religious Services: Not on file  . Active Member of Clubs or Organizations: Not on file  . Attends Archivist Meetings: Not on file  . Marital Status: Not on file   Family History  Problem Relation Age of Onset  . Prostate cancer Father   . Prostate cancer Brother   . Prostate cancer Brother    No Known Allergies Prior to Admission medications   Medication Sig Start Date End Date Taking? Authorizing Provider  acetaminophen (TYLENOL) 500 MG tablet Take 1,000 mg by mouth every 6 (six) hours as needed for mild pain.    Yes [provider]  allopurinol (ZYLOPRIM) 300 MG tablet Take 300 mg by  mouth every morning.  01/18/15  Yes [provider]  amitriptyline (ELAVIL) 50 MG tablet Take 1 tablet (50 mg total) by mouth at bedtime. 07/01/19  Yes Billey Co, MD  aspirin EC 81 MG tablet Take 81 mg by mouth daily.   Yes [provider]  carvedilol (COREG) 6.25 MG tablet Take 6.25 mg by mouth 2 (two) times daily.  06/28/16 06/10/20 Yes [provider]  dapsone 25 MG tablet Take 25 mg by mouth daily as needed.  07/15/17  Yes [provider]  Multiple Vitamins-Minerals (MULTIVITAMIN WITH MINERALS) tablet Take 1 tablet by mouth daily. Gummie   Yes [provider]  omeprazole (PRILOSEC) 20 MG capsule Take 20 mg by mouth every morning.  05/31/15  Yes [provider]  amitriptyline (ELAVIL) 25 MG tablet Take 1 tablet (25 mg  total) by mouth at bedtime. Patient not taking: Reported on 06/07/2020 06/01/19   Billey Co, MD     Positive ROS: All other systems have been reviewed and were otherwise negative with the exception of those mentioned in the HPI and as above.  Physical Exam: General: Alert, no acute distress Cardiovascular: Regular rate and rhythm, no murmurs rubs or gallops.  No pedal edema Respiratory: Clear to auscultation bilaterally, no wheezes rales or rhonchi. No cyanosis, no use of accessory musculature GI: No organomegaly, abdomen is soft and non-tender nondistended with positive bowel sounds. Skin: Skin intact, no lesions within the operative field. Neurologic: Sensation intact distally Psychiatric: Patient is competent for consent with normal mood and affect Lymphatic: No cervical lymphadenopathy  MUSCULOSKELETAL: Right shoulder: Patient can abduct and forward elevate to only partially 45 degrees.  He has significant weakness to shoulder abduction and external rotation.  He does not have weakness to shoulder internal rotation.  He has full digital range of motion, intact sensation light touch and a palpable radial pulse in the right upper extremity.  He has no shoulder instability.  Assessment: right shoulder complete rotator cuff tear  Plan: Plan for Procedure(s): RIGHT SHOULDER ARTHROSCOPY SUBACROMIAL DECOMPRESSION, DISTAL CLAVICLE EXCISION AND MINI-OPEN ROTATOR CUFF REPAIR  I reviewed the details of the operation as well as the postoperative course with the patient and his wife in the office prior to the date of surgery..  I signed his right shoulder according the hospital's correct site of surgery protocol today in the preoperative area and performed a history and physical at the bedside today.  I reviewed his labs and MRI in preparation for this case.  I discussed the risks and benefits of surgery. The risks include but are not limited to infection, bleeding , nerve or blood vessel  injury, joint stiffness or loss of motion, persistent pain, weakness or instability, retear of the rotator cuff, failure of the repair, hardware failure and the need for further surgery. Medical risks include but are not limited to DVT and pulmonary embolism, myocardial infarction, stroke, pneumonia, respiratory failure and death. Patient understood these risks and wished to proceed.     Thornton Park, MD   06/14/2020 11:17 AM

## 2020-06-14 NOTE — Progress Notes (Signed)
Pt sats 87-92 on RA.  Dr Ronelle Nigh notified.  Per dr Ronelle Nigh continue incentive spirometry use.  Will continue to monitor.

## 2020-06-14 NOTE — Op Note (Signed)
06/14/2020  4:27 PM  PATIENT:  Jorge Mose Forester Sr.  69 y.o. male  PRE-OPERATIVE DIAGNOSIS:  right shoulder complete rotator cuff tear  POST-OPERATIVE DIAGNOSIS:  right shoulder complete rotator cuff tear, partial thickness   PROCEDURE:  Procedure(s): RIGHT SHOULDER ARTHROSCOPIC SUBACROMIAL DECOMPRESSION, DISTAL CLAVICLE EXCISION, BICEPS TENOTOMY AND MINI-OPEN ROTATOR CUFF REPAIR   SURGEON:  Surgeon(s) and Role:    Thornton Park, MD - Primary  ANESTHESIA:   local, general and paracervical block   PREOPERATIVE INDICATIONS:  Jorge Wach Cowles Sr. is a  69 y.o. male with a diagnosis of right shoulder complete rotator cuff tear.  Given his pain, weakness and limited range of motion patient was recommended for fixation.   The risks benefits and alternatives were discussed with the patient preoperatively including but not limited to the risks of infection, bleeding, nerve injury, persistent pain or weakness, failure of the hardware, re-tear of the rotator cuff and the need for further surgery. Medical risks include DVT and pulmonary embolism, myocardial infarction, stroke, pneumonia, respiratory failure and death. Patient understood these risks and wished to proceed.  OPERATIVE IMPLANTS: [place] Multifix anchors x 2 & [employer] Q Fix anchors x 2  OPERATIVE PROCEDURE: The patient was met in the preoperative area.  Preparation physical was performed at the bedside.  The right shoulder was signed with the word yes and my initials according the hospital's correct site of surgery protocol.  Patient received an interscalene block by the anesthesia service without Exparel due to the patient's history of sleep apnea.  The patient is brought to the OR and underwent general endotracheal intubation by the anesthesia service.  The patient was placed in a beachchair position. A spider arm positioner was used for this case. Examination under anesthesia revealed no instability and no significant loss  of passive range of motion..  The patient was prepped and draped in a sterile fashion. A timeout was performed to verify the patient's name, date of birth, medical record number, correct site of surgery and correct procedure to be performed there was also used to verify the patient received antibiotics that all appropriate instruments, implants and radiographs studies were available in the room. Once all in attendance were in agreement case began.  Bony landmarks were drawn out with a surgical marker along with proposed arthroscopy incisions. An 11 blade was used to establish a posterior portal through which the arthroscope was placed in the glenohumeral joint. A full diagnostic examination of the shoulder was performed. The anterior portal was established under direct visualization with an 18-gauge spinal needle.  A 5.75 mm arthroscopic cannula was placed through the anterior portal.   The intra-articular portion of the biceps tendon was found to have a partial tear involving greater than 50% of the diameter. Therefore the decision was made to perform a tenotomy. An arthroscopic scissor and a 90 degree ArthroCare wand were used to release the biceps tendon off the superior labrum. The arthroscopic shaver was then used to debride the frayed edges of the labrum. There were no anterior or superior labral tears seen.  Subscapularis tendon was intact. Patient had a full-thickness tear involving the supraspinatus and infraspinatus with retraction. There were no loose bodies within the inferior recess and no evidence of HAGL lesion.  The arthroscope was then placed in the subacromial space. A lateral portal was then established using an 18-gauge spinal needle for localization.   The greater tuberosity was debrided using a 4.0 mm resector shaver blade  to remove all remaining foreign fibers of the rotator cuff.  Debridement was performed until punctate bleeding was seen at the greater tuberosity footprint, which  will allow for rotator cuff healing.  Extensive bursitis was encountered and debrided using a 4-0 resector shaver blade and a 90 ArthroCare wand from the lateral portal. A subacromial decompression was also performed using a 5.5 mm resector shaver blade from the lateral portal. The 5.5 mm resector shaver blade was then placed through the anterior portal and distal clavicle excision was performed. Four ArthroCare Perfect Pass sutures were placed in the lateral border of the rotator cuff tear. All arthroscopic instruments were then removed and the mini-open portion of the procedure began.   A saber-type incision was made along the lateral border of the acromion. The deltoid muscle was identified and split in line with its fibers which allowed visualization of the rotator cuff. The Perfect Pass sutures previously placed in the lateral border of the rotator cuff werealso brought out through the deltoid split. Two Q-Fix anchors were then placed at the articular margin of the humeral head and greater tuberosity. The four suture limbs of each Q Fix anchors were passed medially through the rotator cuff using a first pass suture passer. Two additional Perfect Pass sutures were placed in the lateral border of the rotator cuff.  The Perfect Pass sutures from the lateral border of the rotator cuff were then anchored to thegreater tuberosity of the humeral head using two Multifix anchors. These anchors were tensioned to allow for anatomic reduction of the rotator cuff to the greater tuberosity footprint. The medial row repair was then completed using an arthroscopic knot tying technique with the Q fix anchor sutures. Once all sutures were tied down, arthroscopic images of the double row repair were taken with the arthroscope both externally and arthroscopically fromthe glenohumeral joint  All incisions were copiously irrigated. The deltoid fascia was repaired using a 0 Vicryl suturean interrupted fashion. The  subcutaneous tissue of the mini-open incision was closed with a 2-0 Vicryl. Skin closure for the arthroscopic incisions was performed with 4-0 nylon. The skin edges of the saber incision were approximated with a running 4-0 undyed Monocryl. 0.25%  Marcaine plain was injected into the subacromial space and at the injection sites.  A dry sterile dressing including Steri-Strips was applied. The patient was placed in an abduction sling, with a Polar Care sleeve.  All sharp, sponge and instrument counts were correct at the conclusion of the case. I was scrubbed and present for the entire case. I spoke with the patient's wife in the post-op consultation room and informed her that the case had been performed without complication and the patient was stable in recovery room.     Timoteo Gaul, MD

## 2020-06-14 NOTE — Anesthesia Procedure Notes (Signed)
Procedure Name: Intubation Date/Time: 06/14/2020 11:47 AM Performed by: Jonna Clark, CRNA Pre-anesthesia Checklist: Patient identified, Patient being monitored, Timeout performed, Emergency Drugs available and Suction available Patient Re-evaluated:Patient Re-evaluated prior to induction Oxygen Delivery Method: Circle system utilized Preoxygenation: Pre-oxygenation with 100% oxygen Induction Type: IV induction Ventilation: Mask ventilation without difficulty Laryngoscope Size: McGraph and 4 Grade View: Grade II Tube type: Oral Tube size: 7.5 mm Number of attempts: 1 Airway Equipment and Method: Stylet Placement Confirmation: ETT inserted through vocal cords under direct vision,  positive ETCO2 and breath sounds checked- equal and bilateral Secured at: 22 cm Tube secured with: Tape Dental Injury: Teeth and Oropharynx as per pre-operative assessment  Difficulty Due To: Difficulty was anticipated and Difficult Airway- due to anterior larynx Comments: Recommend use of McGrath for good view

## 2020-06-15 ENCOUNTER — Encounter: Payer: Self-pay | Admitting: Orthopedic Surgery

## 2022-09-27 ENCOUNTER — Encounter: Payer: Self-pay | Admitting: Internal Medicine

## 2022-09-28 IMAGING — CT CT SHOULDER*R* W/CM
2 series · 14 of 20 positions shown, 17 images · IV contrast (agent unspecified)
Comparison: None.

CLINICAL DATA: Right shoulder pain and inability to lift arm above
head after feeling a pop while lifting a heavy trash bag a month
ago. No prior surgery.

EXAM:
CT OF THE UPPER RIGHT EXTREMITY WITH CONTRAST (CT ARTHROGRAM)
TECHNIQUE: Multidetector CT imaging of the right shoulder was performed
according to the standard protocol following intra-articular
contrast administration.
CONTRAST:  See injection documentation.

[Series 8: ax st · axial · 0.54mm/px · z∈[-297,-169]mm · 11 of 83 slices shown, 14 images]
[im 7/83  soft-tissue]
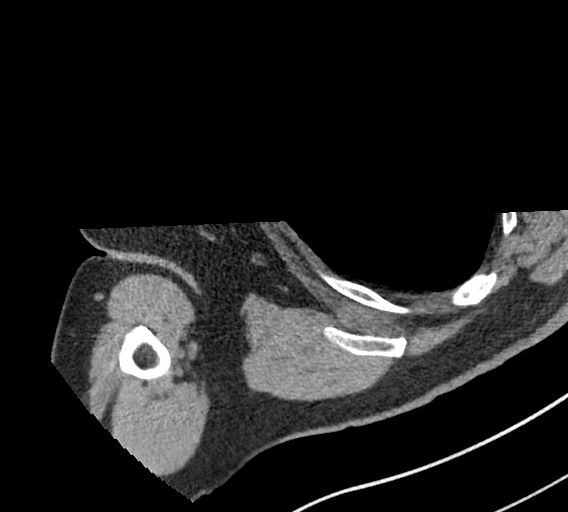
[im 7/83  bone]
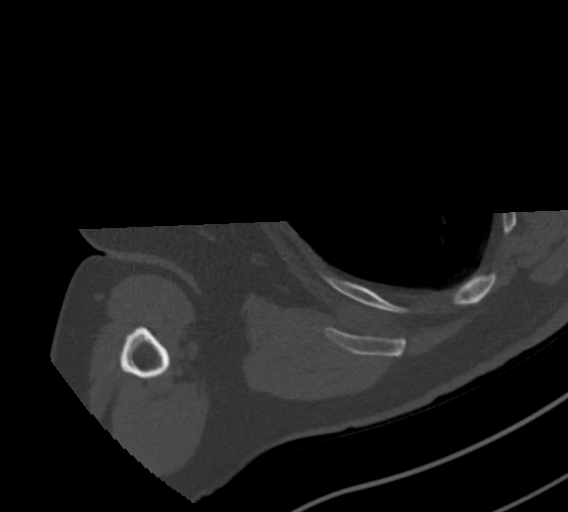
[im 13/83  bone]
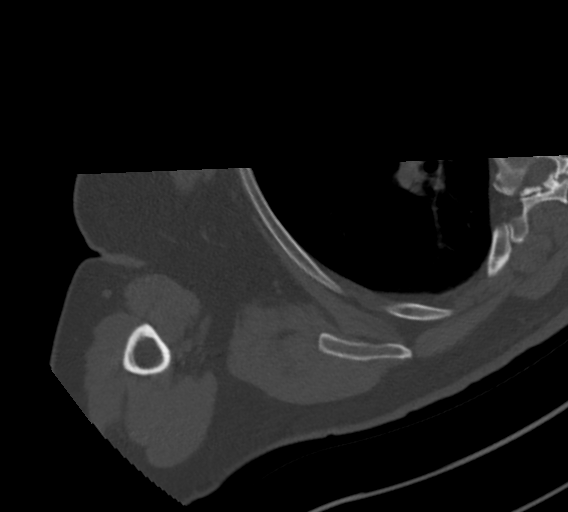
[im 19/83  bone]
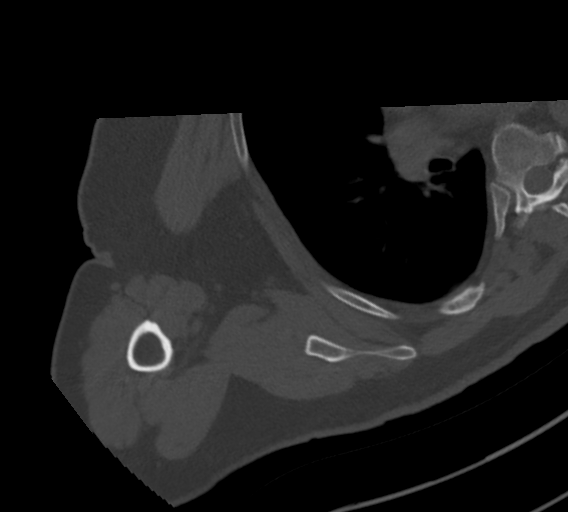
[im 26/83  bone]
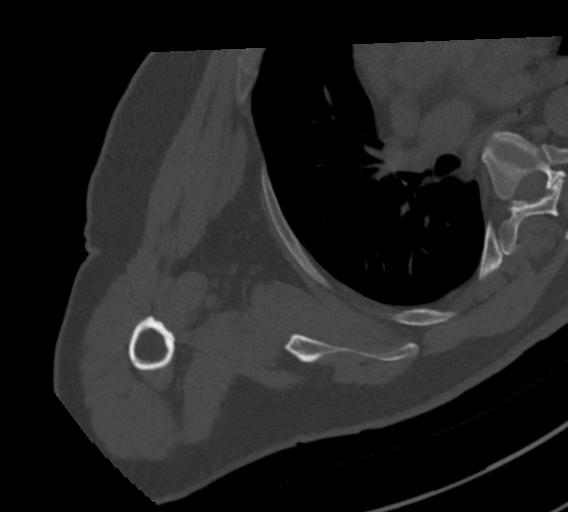
[im 32/83  soft-tissue]
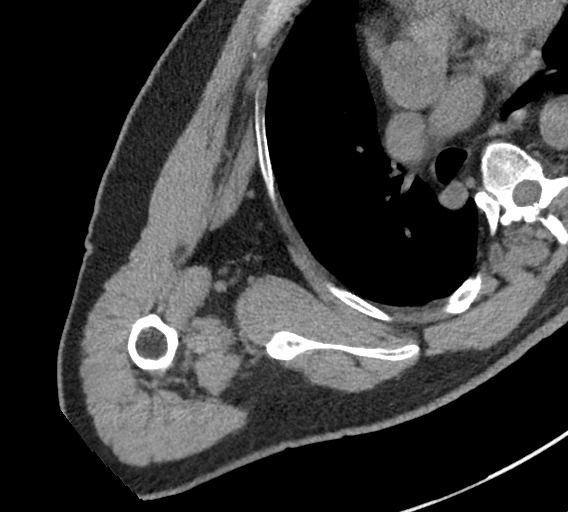
[im 32/83  bone]
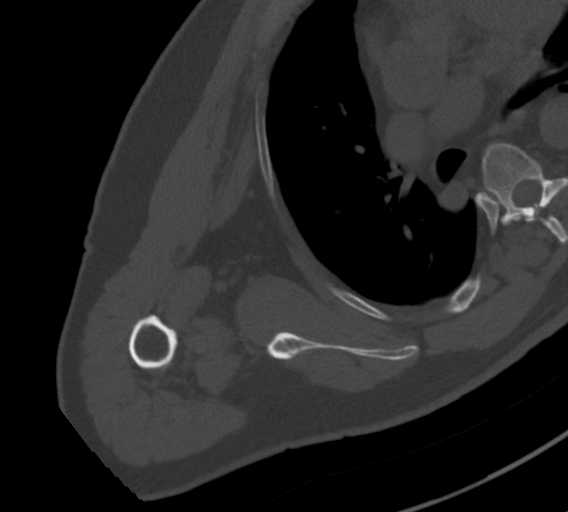
[im 45/83  bone]
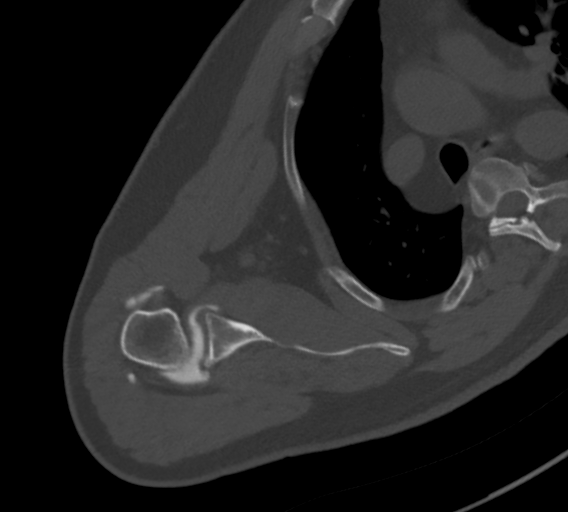
[im 51/83  bone]
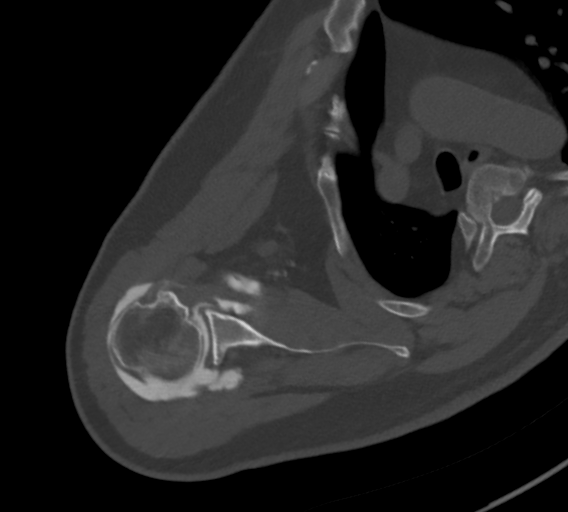
[im 57/83  bone]
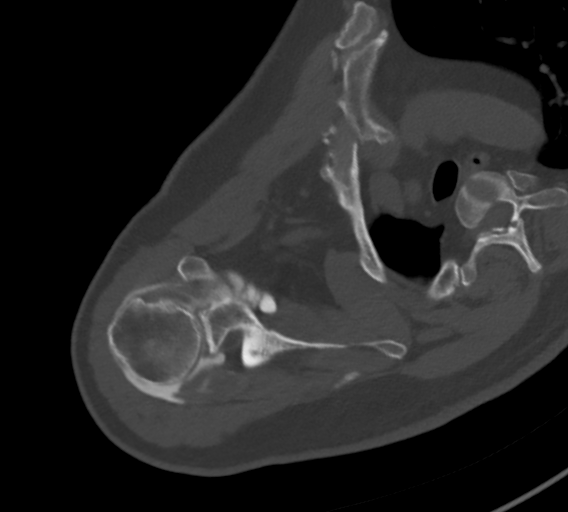
[im 64/83  soft-tissue]
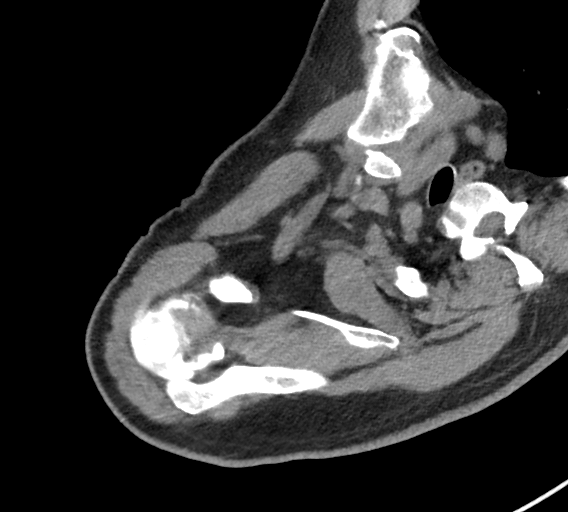
[im 64/83  bone]
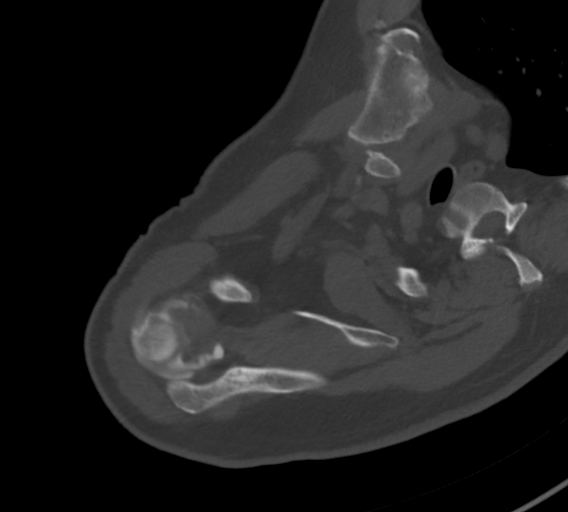
[im 70/83  bone]
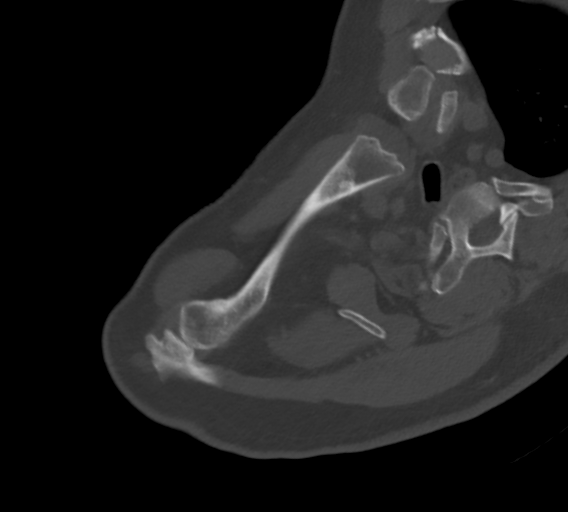
[im 76/83  bone]
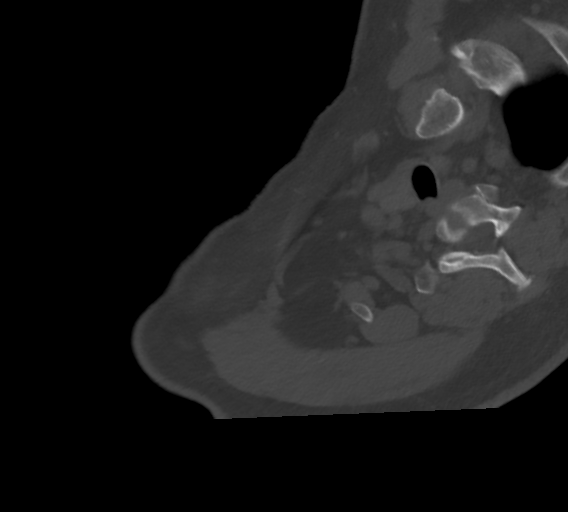

[Series 16: aber cor st · coronal · 0.49mm/px · 3 of 60 slices shown]
[im 12/60  bone]
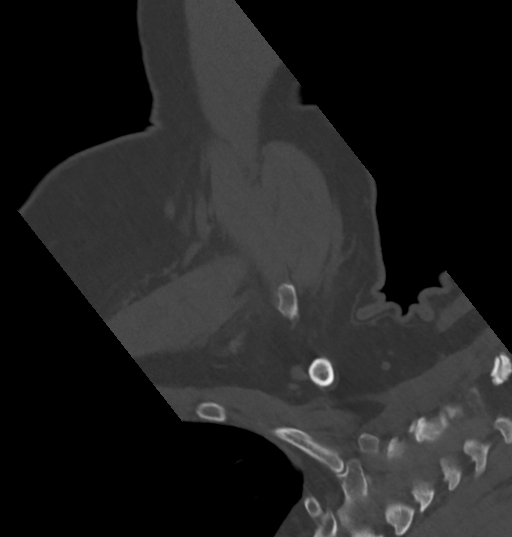
[im 24/60  bone]
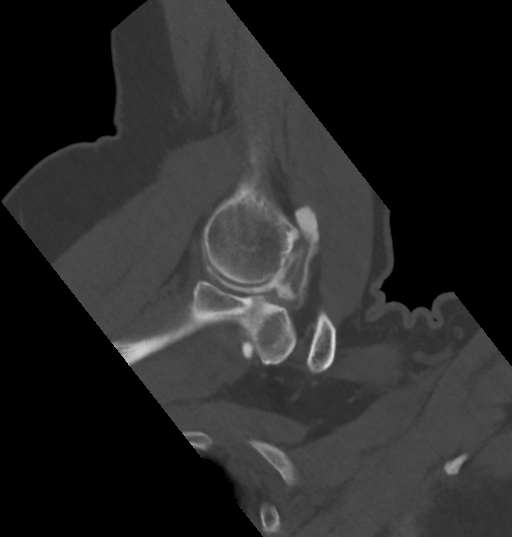
[im 36/60  bone]
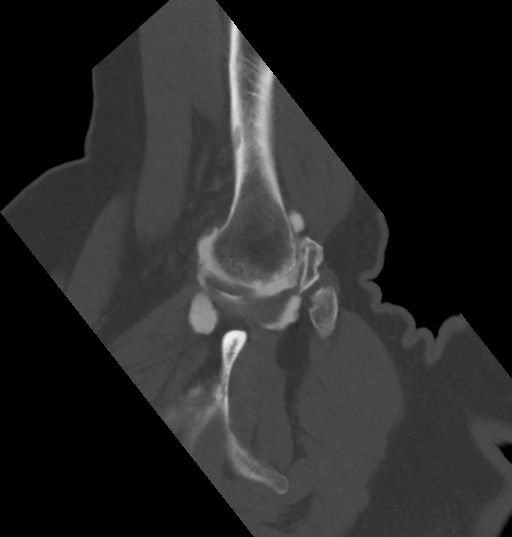

[14 of 20 positions shown; findings below may reference images not displayed]

FINDINGS: Rotator cuff: Full-thickness, full width tears of the supraspinatus
and infraspinatus tendons with up to 3.5 cm retraction to the medial
humeral head. Intact subscapularis and teres minor tendons.

Muscles:  No muscle atrophy.

Biceps long head:  Intact and normally positioned.

Acromioclavicular Joint: The acromion is type II. There are mild
acromioclavicular degenerative changes. Small amount of contrast in
the subacromial/subdeltoid bursa.

Glenohumeral Joint: Distended with intra-articular contrast. Diffuse
cartilage thinning over the humeral head without focal defect.

Labrum:  No evidence of labral tear.

Bones: No acute or significant extra-articular osseous findings.

Other: None.
IMPRESSION: 1. Full-thickness, full width tears of the supraspinatus and
infraspinatus tendons with up to 3.5 cm retraction to the medial
humeral head. No muscle atrophy.
2. Mild acromioclavicular and glenohumeral osteoarthritis.

## 2022-10-01 ENCOUNTER — Encounter: Payer: Self-pay | Admitting: Internal Medicine

## 2022-10-03 ENCOUNTER — Encounter: Payer: Self-pay | Admitting: Gastroenterology

## 2022-10-03 NOTE — H&P (Signed)
Pre-Procedure H&P   Patient ID: Jorge Diaz is a 72 y.o. male.  Gastroenterology Provider: Annamaria Helling, DO  PCP: Idelle Crouch, MD  Date: 10/04/2022  HPI [name] is a 72 y.o. male who presents today for Colonoscopy for Surveillance-personal history of colon polyps .  Patient last underwent colonoscopy in July 2018 with 1 tubular adenoma, diverticulosis and internal hemorrhoids.  In 2008 he was noted to have diverticulosis and internal hemorrhoids.  He reports regular bowel movements melena hematochezia diarrhea or constipation  Has a history of celiac disease with dermatitis herpetiformis which are controlled with dapsone.  He does not regularly follow a gluten-free diet.  Reflux controlled on PPI  HFpEF with grade 1 diastolic dysfunction.  EF 50 to 55% as of June 2022.  Hemoglobin 17.1 most recent labs MCV 92 platelets 233,000 creatinine 1.2.  Cleared for procedure by his PCP   Past Medical History:  Diagnosis Date   BPH (benign prostatic hyperplasia)    Cancer ([place])    BASAL CELL/MELANOMA   Dermatitis herpetiformis    Diverticulosis    Erectile dysfunction    Erythrocytosis    Esophagitis    Gastritis    Gout    Hyperglycemia    Hyperlipidemia    Hypertension    Inappropriate sinus node tachycardia    Internal hemorrhoids    Osteoarthritis    Polycythemia    Pre-diabetes    Prediabetes    Right rotator cuff tear    Sleep apnea    DOES NOT USE CPAP    Past Surgical History:  Procedure Laterality Date   CARPAL TUNNEL RELEASE Bilateral    COLONOSCOPY WITH ESOPHAGOGASTRODUODENOSCOPY (EGD)  03/28/2007   SHOULDER ARTHROSCOPY WITH OPEN ROTATOR CUFF REPAIR AND DISTAL CLAVICLE ACROMINECTOMY Right 06/14/2020   Procedure: RIGHT SHOULDER ARTHROSCOPY SUBACCROMINAL DECOMPRESSION, DISTAL CLAVICLE EXCISION AND MINI-OPEN ROTATOR CUFF REPAIR;  Surgeon: Thornton Park, MD;  Location: [facility] ORS;  Service: Orthopedics;  Laterality: Right;    Family  History No h/o GI disease or malignancy  Review of Systems  Constitutional:  Negative for activity change, appetite change, chills, diaphoresis, fatigue, fever and unexpected weight change.  HENT:  Negative for trouble swallowing and voice change.   Respiratory:  Negative for shortness of breath and wheezing.   Cardiovascular:  Negative for chest pain, palpitations and leg swelling.  Gastrointestinal:  Negative for abdominal distention, abdominal pain, anal bleeding, blood in stool, constipation, diarrhea, nausea and vomiting.  Musculoskeletal:  Negative for arthralgias and myalgias.  Skin:  Negative for color change and pallor.  Neurological:  Negative for dizziness, syncope and weakness.  Psychiatric/Behavioral:  Negative for confusion. The patient is not nervous/anxious.   All other systems reviewed and are negative.    Medications No current facility-administered medications on file prior to encounter.   Current Outpatient Medications on File Prior to Encounter  Medication Sig Dispense Refill   allopurinol (ZYLOPRIM) 300 MG tablet Take 300 mg by mouth every morning.      carvedilol (COREG) 6.25 MG tablet Take 6.25 mg by mouth 2 (two) times daily.      Multiple Vitamins-Minerals (MULTIVITAMIN WITH MINERALS) tablet Take 1 tablet by mouth daily. Gummie     omeprazole (PRILOSEC) 20 MG capsule Take 20 mg by mouth every morning.      amitriptyline (ELAVIL) 50 MG tablet Take 1 tablet (50 mg total) by mouth at bedtime. 30 tablet 11   aspirin EC 81 MG tablet Take 81 mg by  mouth daily.     dapsone 25 MG tablet Take 25 mg by mouth daily as needed.   0   ondansetron (ZOFRAN) 4 MG tablet Take 1 tablet (4 mg total) by mouth every 8 (eight) hours as needed for nausea or vomiting. 30 tablet 0   oxyCODONE (OXY IR/ROXICODONE) 5 MG immediate release tablet Take 1 tablet (5 mg total) by mouth every 4 (four) hours as needed. (Patient not taking: Reported on 10/04/2022) 40 tablet 0    Pertinent  medications related to GI and procedure were reviewed by me with the patient prior to the procedure   Current Facility-Administered Medications:    0.9 %  sodium chloride infusion, , Intravenous, Continuous, Annamaria Helling, DO, Last Rate: 20 mL/hr at 10/04/22 0840, New Bag at 10/04/22 0840      No Known Allergies Allergies were reviewed by me prior to the procedure  Objective   Body mass index is 34.78 kg/m. Vitals:   10/04/22 0830  BP: (!) 143/95  Pulse: 97  Resp: 16  Temp: (!) 97.5 F (36.4 C)  TempSrc: Temporal  SpO2: 97%  Weight: 94.8 kg  Height: '5\' 5"'$  (1.651 m)     Physical Exam Vitals and nursing note reviewed.  Constitutional:      General: He is not in acute distress.    Appearance: Normal appearance. He is not ill-appearing, toxic-appearing or diaphoretic.  HENT:     Head: Normocephalic and atraumatic.     Nose: Nose normal.     Mouth/Throat:     Mouth: Mucous membranes are moist.     Pharynx: Oropharynx is clear.  Eyes:     General: No scleral icterus.    Extraocular Movements: Extraocular movements intact.  Cardiovascular:     Rate and Rhythm: Regular rhythm. Tachycardia present.     Heart sounds: Normal heart sounds. No murmur heard.    No friction rub. No gallop.  Pulmonary:     Effort: Pulmonary effort is normal. No respiratory distress.     Breath sounds: Normal breath sounds. No wheezing, rhonchi or rales.  Abdominal:     General: Bowel sounds are normal. There is no distension.     Palpations: Abdomen is soft.     Tenderness: There is no abdominal tenderness. There is no guarding or rebound.  Musculoskeletal:     Cervical back: Neck supple.     Right lower leg: No edema.     Left lower leg: No edema.  Skin:    General: Skin is warm and dry.     Coloration: Skin is not jaundiced or pale.  Neurological:     General: No focal deficit present.     Mental Status: He is alert and oriented to person, place, and time. Mental status is at  baseline.  Psychiatric:        Mood and Affect: Mood normal.        Behavior: Behavior normal.        Thought Content: Thought content normal.        Judgment: Judgment normal.      Assessment:  [name] is a 72 y.o. male  who presents today for Colonoscopy for Surveillance-personal history of colon polyps .  Plan:  Colonoscopy with possible intervention today  Colonoscopy with possible biopsy, control of bleeding, polypectomy, and interventions as necessary has been discussed with the patient/patient representative. Informed consent was obtained from the patient/patient representative after explaining the indication, nature, and risks of the  procedure including but not limited to death, bleeding, perforation, missed neoplasm/lesions, cardiorespiratory compromise, and reaction to medications. Opportunity for questions was given and appropriate answers were provided. Patient/patient representative has verbalized understanding is amenable to undergoing the procedure.   Annamaria Helling, DO  [facility]  Portions of the record may have been created with voice recognition software. Occasional wrong-word or 'sound-a-like' substitutions may have occurred due to the inherent limitations of voice recognition software.  Read the chart carefully and recognize, using context, where substitutions may have occurred.

## 2022-10-04 ENCOUNTER — Encounter: Payer: Self-pay | Admitting: Gastroenterology

## 2022-10-04 ENCOUNTER — Ambulatory Visit: Payer: Medicare HMO | Admitting: Anesthesiology

## 2022-10-04 ENCOUNTER — Ambulatory Visit
Admission: RE | Admit: 2022-10-04 | Discharge: 2022-10-04 | Disposition: A | Discharge status: Home / Self Care | Payer: Medicare HMO | Attending: Gastroenterology | Admitting: Gastroenterology

## 2022-10-04 ENCOUNTER — Other Ambulatory Visit: Payer: Self-pay

## 2022-10-04 ENCOUNTER — Encounter
Admission: RE | Disposition: A | Discharge status: Home / Self Care | Payer: Self-pay | Source: Home / Self Care | Attending: Gastroenterology

## 2022-10-04 DIAGNOSIS — K219 Gastro-esophageal reflux disease without esophagitis: Secondary | ICD-10-CM | POA: Diagnosis not present

## 2022-10-04 DIAGNOSIS — I110 Hypertensive heart disease with heart failure: Secondary | ICD-10-CM | POA: Insufficient documentation

## 2022-10-04 DIAGNOSIS — D123 Benign neoplasm of transverse colon: Secondary | ICD-10-CM | POA: Insufficient documentation

## 2022-10-04 DIAGNOSIS — I5032 Chronic diastolic (congestive) heart failure: Secondary | ICD-10-CM | POA: Diagnosis not present

## 2022-10-04 DIAGNOSIS — K621 Rectal polyp: Secondary | ICD-10-CM | POA: Insufficient documentation

## 2022-10-04 DIAGNOSIS — G4730 Sleep apnea, unspecified: Secondary | ICD-10-CM | POA: Diagnosis not present

## 2022-10-04 DIAGNOSIS — K5730 Diverticulosis of large intestine without perforation or abscess without bleeding: Secondary | ICD-10-CM | POA: Diagnosis not present

## 2022-10-04 DIAGNOSIS — L130 Dermatitis herpetiformis: Secondary | ICD-10-CM | POA: Diagnosis not present

## 2022-10-04 DIAGNOSIS — Z1211 Encounter for screening for malignant neoplasm of colon: Secondary | ICD-10-CM | POA: Insufficient documentation

## 2022-10-04 DIAGNOSIS — Z85828 Personal history of other malignant neoplasm of skin: Secondary | ICD-10-CM | POA: Insufficient documentation

## 2022-10-04 DIAGNOSIS — K640 First degree hemorrhoids: Secondary | ICD-10-CM | POA: Diagnosis not present

## 2022-10-04 DIAGNOSIS — K529 Noninfective gastroenteritis and colitis, unspecified: Secondary | ICD-10-CM | POA: Diagnosis not present

## 2022-10-04 DIAGNOSIS — D122 Benign neoplasm of ascending colon: Secondary | ICD-10-CM | POA: Insufficient documentation

## 2022-10-04 DIAGNOSIS — Z85820 Personal history of malignant melanoma of skin: Secondary | ICD-10-CM | POA: Insufficient documentation

## 2022-10-04 DIAGNOSIS — M109 Gout, unspecified: Secondary | ICD-10-CM | POA: Insufficient documentation

## 2022-10-04 DIAGNOSIS — K900 Celiac disease: Secondary | ICD-10-CM | POA: Insufficient documentation

## 2022-10-04 HISTORY — PX: COLONOSCOPY: SHX5424

## 2022-10-04 SURGERY — COLONOSCOPY
Anesthesia: General

## 2022-10-04 MED ORDER — PROPOFOL 500 MG/50ML IV EMUL
INTRAVENOUS | Status: DC | PRN
  Administered 2022-10-04: 125 ug/kg/min via INTRAVENOUS
  Administered 2022-10-04: 200 ug/kg/min via INTRAVENOUS

## 2022-10-04 MED ORDER — LIDOCAINE HCL (PF) 2 % IJ SOLN
INTRAMUSCULAR | Status: AC
  Filled 2022-10-04: qty 5

## 2022-10-04 MED ORDER — LIDOCAINE HCL (CARDIAC) PF 100 MG/5ML IV SOSY
PREFILLED_SYRINGE | INTRAVENOUS | Status: DC | PRN
  Administered 2022-10-04: 80 mg via INTRAVENOUS

## 2022-10-04 MED ORDER — SODIUM CHLORIDE 0.9 % IV SOLN: INTRAVENOUS | Status: DC

## 2022-10-04 MED ORDER — PROPOFOL 1000 MG/100ML IV EMUL
INTRAVENOUS | Status: AC
  Filled 2022-10-04: qty 100

## 2022-10-04 MED ORDER — PROPOFOL 10 MG/ML IV BOLUS
INTRAVENOUS | Status: DC | PRN
  Administered 2022-10-04: 50 mg via INTRAVENOUS

## 2022-10-04 MED ORDER — DEXMEDETOMIDINE HCL IN NACL 80 MCG/20ML IV SOLN
INTRAVENOUS | Status: AC
  Filled 2022-10-04: qty 20

## 2022-10-04 NOTE — Anesthesia Preprocedure Evaluation (Signed)
Anesthesia Evaluation  Patient identified by MRN, date of birth, ID band Patient awake    Reviewed: Allergy & Precautions, H&P , NPO status , Patient's Chart, lab work & pertinent test results  Airway Mallampati: II  TM Distance: >3 FB Neck ROM: full    Dental no notable dental hx.    Pulmonary sleep apnea    Pulmonary exam normal        Cardiovascular hypertension, Normal cardiovascular exam     Neuro/Psych negative neurological ROS  negative psych ROS   GI/Hepatic Neg liver ROS,GERD  Controlled and Medicated,,  Endo/Other  negative endocrine ROS    Renal/GU negative Renal ROS  negative genitourinary   Musculoskeletal  (+) Arthritis ,    Abdominal   Peds  Hematology negative hematology ROS (+)   Anesthesia Other Findings Past Medical History: No date: BPH (benign prostatic hyperplasia) No date: Cancer ([place])     Comment:  BASAL CELL/MELANOMA No date: Dermatitis herpetiformis No date: Diverticulosis No date: Erectile dysfunction No date: Erythrocytosis No date: Esophagitis No date: Gastritis No date: Gout No date: Hyperglycemia No date: Hyperlipidemia No date: Hypertension No date: Inappropriate sinus node tachycardia No date: Internal hemorrhoids No date: Osteoarthritis No date: Polycythemia No date: Pre-diabetes No date: Prediabetes No date: Right rotator cuff tear No date: Sleep apnea     Comment:  DOES NOT USE CPAP  Past Surgical History: No date: CARPAL TUNNEL RELEASE; Bilateral 03/28/2007: COLONOSCOPY WITH ESOPHAGOGASTRODUODENOSCOPY (EGD) 06/14/2020: SHOULDER ARTHROSCOPY WITH OPEN ROTATOR CUFF REPAIR AND  DISTAL CLAVICLE ACROMINECTOMY; Right     Comment:  Procedure: RIGHT SHOULDER ARTHROSCOPY SUBACCROMINAL               DECOMPRESSION, DISTAL CLAVICLE EXCISION AND MINI-OPEN               ROTATOR CUFF REPAIR;  Surgeon: Thornton Park, MD;                Location: [facility] ORS;  Service:  Orthopedics;  Laterality:               Right;  BMI    Body Mass Index: 34.78 kg/m      Reproductive/Obstetrics negative OB ROS                             Anesthesia Physical Anesthesia Plan  ASA: 2  Anesthesia Plan: General   Post-op Pain Management:    Induction:   PONV Risk Score and Plan: Propofol infusion and TIVA  Airway Management Planned:   Additional Equipment:   Intra-op Plan:   Post-operative Plan:   Informed Consent: I have reviewed the patients History and Physical, chart, labs and discussed the procedure including the risks, benefits and alternatives for the proposed anesthesia with the patient or authorized representative who has indicated his/her understanding and acceptance.     Dental Advisory Given  Plan Discussed with: CRNA and Surgeon  Anesthesia Plan Comments:        Anesthesia Quick Evaluation

## 2022-10-04 NOTE — Transfer of Care (Signed)
Immediate Anesthesia Transfer of Care Note  Patient: Jorge Diaz  Procedure(s) Performed: COLONOSCOPY  Patient Location: PACU  Anesthesia Type:General  Level of Consciousness: sedated  Airway & Oxygen Therapy: Patient Spontanous Breathing  Post-op Assessment: Report given to RN and Post -op Vital signs reviewed and stable  Post vital signs: Reviewed and stable  Last Vitals:  Vitals Value Taken Time  BP    Temp    Pulse    Resp    SpO2      Last Pain:  Vitals:   10/04/22 0830  TempSrc: Temporal  PainSc: 0-No pain         Complications: No notable events documented.

## 2022-10-04 NOTE — Interval H&P Note (Signed)
History and Physical Interval Note: Preprocedure H&P from 10/04/22  was reviewed and there was no interval change after seeing and examining the patient.  Written consent was obtained from the patient after discussion of risks, benefits, and alternatives. Patient has consented to proceed with Colonoscopy with possible intervention   10/04/2022 9:23 AM  Jorge Diaz  has presented today for surgery, with the diagnosis of Personal history of colonic polyps (Z86.010).  The various methods of treatment have been discussed with the patient and family. After consideration of risks, benefits and other options for treatment, the patient has consented to  Procedure(s): COLONOSCOPY (N/A) as a surgical intervention.  The patient's history has been reviewed, patient examined, no change in status, stable for surgery.  I have reviewed the patient's chart and labs.  Questions were answered to the patient's satisfaction.     Annamaria Helling

## 2022-10-04 NOTE — Anesthesia Postprocedure Evaluation (Signed)
Anesthesia Post Note  Patient: Jorge Diaz  Procedure(s) Performed: COLONOSCOPY  Patient location during evaluation: PACU Anesthesia Type: General Level of consciousness: awake and alert Pain management: pain level controlled Vital Signs Assessment: post-procedure vital signs reviewed and stable Respiratory status: spontaneous breathing, nonlabored ventilation and respiratory function stable Cardiovascular status: blood pressure returned to baseline and stable Postop Assessment: no apparent nausea or vomiting Anesthetic complications: no   No notable events documented.   Last Vitals:  Vitals:   10/04/22 1014 10/04/22 1024  BP: 117/74 120/73  Pulse: 81 79  Resp: 20 15  Temp:    SpO2: 97% 97%    Last Pain:  Vitals:   10/04/22 1024  TempSrc:   PainSc: 0-No pain                 [place] Ouch

## 2022-10-04 NOTE — Op Note (Signed)
[facility] Patient Name: Jorge Diaz Procedure Date: 10/04/2022 9:21 AM MRN: [id number] Account #: [id number] Date of Birth: 25-Oct-1950 Admit Type: Outpatient Age: 72 Room: [facility] ENDO ROOM 1 Gender: Male Note Status: Finalized Instrument Name: Colonscope [id number] Procedure:             Colonoscopy Indications:           High risk colon cancer surveillance: Personal history                         of colonic polyps Providers:             Rueben Bash, DO Referring MD:          Leonie Douglas. Doy Hutching, MD (Referring MD) Medicines:             Monitored Anesthesia Care Complications:         No immediate complications. Estimated blood loss:                         Minimal. Procedure:             Pre-Anesthesia Assessment:                        - Prior to the procedure, a History and Physical was                         performed, and patient medications and allergies were                         reviewed. The patient is competent. The risks and                         benefits of the procedure and the sedation options and                         risks were discussed with the patient. All questions                         were answered and informed consent was obtained.                         Patient identification and proposed procedure were                         verified by the physician, the nurse, the anesthetist                         and the technician in the endoscopy suite. Mental                         Status Examination: alert and oriented. Airway                         Examination: normal oropharyngeal airway and neck                         mobility. Respiratory Examination: clear to  auscultation. CV Examination: RRR, no murmurs, no S3                         or S4. Prophylactic Antibiotics: The patient does not                         require prophylactic antibiotics. Prior                          Anticoagulants: The patient has taken no anticoagulant                         or antiplatelet agents. ASA Grade Assessment: II - A                         patient with mild systemic disease. After reviewing                         the risks and benefits, the patient was deemed in                         satisfactory condition to undergo the procedure. The                         anesthesia plan was to use monitored anesthesia care                         (MAC). Immediately prior to administration of                         medications, the patient was re-assessed for adequacy                         to receive sedatives. The heart rate, respiratory                         rate, oxygen saturations, blood pressure, adequacy of                         pulmonary ventilation, and response to care were                         monitored throughout the procedure. The physical                         status of the patient was re-assessed after the                         procedure.                        After obtaining informed consent, the colonoscope was                         passed under direct vision. Throughout the procedure,                         the patient's blood pressure, pulse, and oxygen  saturations were monitored continuously. The                         Colonoscope was introduced through the anus and                         advanced to the the terminal ileum, with                         identification of the appendiceal orifice and IC                         valve. The colonoscopy was performed without                         difficulty. The patient tolerated the procedure well.                         The quality of the bowel preparation was evaluated                         using the BBPS [facility] Bowel Preparation Scale) with                         scores of: Right Colon = 2 (minor amount of residual                         staining, small fragments of  stool and/or opaque                         liquid, but mucosa seen well), Transverse Colon = 3                         (entire mucosa seen well with no residual staining,                         small fragments of stool or opaque liquid) and Left                         Colon = 2 (minor amount of residual staining, small                         fragments of stool and/or opaque liquid, but mucosa                         seen well). The total BBPS score equals 7. The quality                         of the bowel preparation was good. The terminal ileum,                         ileocecal valve, appendiceal orifice, and rectum were                         photographed. Findings:      The perianal and digital rectal examinations were normal. Pertinent       negatives include normal sphincter  tone.      The terminal ileum appeared normal. Estimated blood loss: none.      Non-bleeding internal hemorrhoids were found during retroflexion. The       hemorrhoids were Grade I (internal hemorrhoids that do not prolapse).       Estimated blood loss: none.      Two sessile polyps were found in the rectum and ascending colon. The       polyps were 1 to 2 mm in size. These polyps were removed with a jumbo       cold forceps. Resection and retrieval were complete. Estimated blood       loss was minimal. Estimated blood loss was minimal.      A 5 to 6 mm polyp was found in the transverse colon. The polyp was       sessile. The polyp was removed with a cold snare. Resection and       retrieval were complete. Estimated blood loss was minimal.      Multiple small-mouthed diverticula were found in the entire colon.       Estimated blood loss: none.      Localized mild inflammation characterized by congestion (edema) and       erythema around divertiula was found at 25 cm proximal to the anus.       Possibly SCAD. Biopsies were taken with a cold forceps for histology.       Estimated blood loss was minimal.       The exam was otherwise without abnormality on direct and retroflexion       views. Impression:            - The examined portion of the ileum was normal.                        - Non-bleeding internal hemorrhoids.                        - Two 1 to 2 mm polyps in the rectum and in the                         ascending colon, removed with a jumbo cold forceps.                         Resected and retrieved.                        - One 5 to 6 mm polyp in the transverse colon, removed                         with a cold snare. Resected and retrieved.                        - Diverticulosis in the entire examined colon.                        - Localized mild inflammation was found at 25 cm                         proximal to the anus. Biopsied.                        -  The examination was otherwise normal on direct and                         retroflexion views. Recommendation:        - Patient has a contact number available for                         emergencies. The signs and symptoms of potential                         delayed complications were discussed with the patient.                         Return to normal activities tomorrow. Written                         discharge instructions were provided to the patient.                        - Discharge patient to home.                        - Resume previous diet.                        - Continue present medications.                        - No aspirin, ibuprofen, naproxen, or other                         non-steroidal anti-inflammatory drugs for 5 days after                         polyp removal.                        - Await pathology results.                        - Repeat colonoscopy for surveillance based on                         pathology results.                        - Return to referring physician as previously                         scheduled.                        - The findings and recommendations were discussed  with                         the patient. Procedure Code(s):     --- Professional ---                        [phone], Colonoscopy, flexible; with removal of  tumor(s), polyp(s), or other lesion(s) by snare                         technique                        45380, 59, Colonoscopy, flexible; with biopsy, single                         or multiple Diagnosis Code(s):     --- Professional ---                        Z86.010, Personal history of colonic polyps                        K64.0, First degree hemorrhoids                        D12.8, Benign neoplasm of rectum                        D12.2, Benign neoplasm of ascending colon                        D12.3, Benign neoplasm of transverse colon (hepatic                         flexure or splenic flexure)                        K52.9, Noninfective gastroenteritis and colitis,                         unspecified                        K57.30, Diverticulosis of large intestine without                         perforation or abscess without bleeding CPT copyright 2022 American [employer]. All rights reserved. The codes documented in this report are preliminary and upon coder review may  be revised to meet current compliance requirements. Attending Participation:      I personally performed the entire procedure. Volney American, DO Annamaria Helling DO, DO 10/04/2022 10:04:39 AM This report has been signed electronically. Number of Addenda: 0 Note Initiated On: 10/04/2022 9:21 AM Scope Withdrawal Time: 0 hours 18 minutes 44 seconds  Total Procedure Duration: 0 hours 23 minutes 13 seconds  Estimated Blood Loss:  Estimated blood loss was minimal.      [facility]

## 2022-10-05 ENCOUNTER — Encounter: Payer: Self-pay | Admitting: Gastroenterology

## 2022-10-05 LAB — SURGICAL PATHOLOGY

## 2023-07-18 ENCOUNTER — Ambulatory Visit: Payer: Self-pay | Admitting: General Surgery

## 2023-07-18 NOTE — H&P (Signed)
History of Present Illness (HPI)   72 year old male patient comes for evaluation of lipoma of the back, referred by Dr. Judithann Sheen. The patient stated that the lipoma has been present for several years without any associated symptoms such as pain or discomfort. The mass has not been previously checked prior to this visit. The patient has not felt the mass without being told about it.  There is no pain radiation.  There is no alleviating or aggravating factors.     Pertinent Positives   Lipoma present for several years Mass is 10-11 cm in size Pertinent Negatives   No pain No previous infections No redness in the area No changes on the skin No obstruction to arm movement Review of Systems Default   Constitutional - Negative for fever, chills HENT - Negative for ear pain, sore throat Eyes - Negative for pain and visual disturbance. Respiratory - Negative for cough, chest tightness and shortness of breath. Cardiovascular - Negative for chest pain, palpitations nor leg swelling. Gastrointestinal - Negative for nausea, vomiting, abdominal pain nor diarrhea. Endocrine - Negative for polydipsia nor polyuria. Genitourinary - Negative for dysuria nor hematuria. Musculoskeletal - 10-11 cm mass on left upper back, no noticeable pain Skin - 10-11 cm mass on left upper back, no changes on the skin Neurological - Negative for dizziness, seizures, focal weakness and headaches. Psychiatric/Behavioral - Negative for behavioral problems, confusion and agitation. No suicidal ideation, no homicidal ideation, no hallucinations, no delusions. Physical Exam Default   Abdomen - Bowel sounds normal, no tenderness, organomegaly, masses, or hernia. Extremities - No amputations or deformities, cyanosis, edema or varicosities, peripheral pulses intact. Eyes - Conjunctiva clear, sclera non-icteric, EOM intact, no exudates or hemorrhages General - Well appearing, well nourished, in no distress. Oriented x 3, normal  mood and affect. Ambulating without difficulty. Head - Normocephalic, atraumatic, no visible or palpable masses, depressions, or scaring. Heart - No cardiomegaly; regular rate and rhythm, no murmur or gallop. Integumentary - 10-11 cm mass on left upper back, no noticeable pain or skin changes Lungs - Clear to auscultation and percussion. Mouth - Mucous membranes moist, no mucosal lesions. Musculoskeletal - Normal gait and station. No misalignment, asymmetry, crepitation, defects, tenderness, masses, effusions, decreased range of motion, instability, atrophy or abnormal strength or tone in the head, neck, spine, ribs, pelvis or extremities. Neck - Supple, without lesions, bruits, or adenopathy, thyroid non-enlarged and non-tender. Neurologic - Sensation to pain, touch, and proprioception normal. Psychiatric - Oriented X3, intact recent and remote memory, judgment and insight, normal mood and affect. Rectal - Normal sphincter tone, no hemorrhoids or masses palpable. Skin - Good turgor, no rash, unusual bruising or prominent lesions. Medical History   Hyperlipidemia Obstructive sleep apnea Prediabetes Gastritis Medications   Aspirin - 81mg  daily Carvedilol - 6.25mg  twice a day Dapsone - 25mg  once daily Omeprazole - 20mg  daily Assessment and Plan   Soft tissue mass of back- Mass is most likely benign, such as lipoma and might keep growing; options are to monitor or surgically excise. Due to its size, surgery is indicated in an operating room with sedation. Follow-up depends on results of stress test scheduled before surgery.   We discussed about surgical intervention of excision of the soft tissue mass.  We discussed about the risk including bleeding, infection, chronic pain, seroma, among others.  Patient and wife report they understood and agreed with plan.   Carolan Shiver, MD, FACS

## 2023-08-01 ENCOUNTER — Inpatient Hospital Stay
Admission: RE | Admit: 2023-08-01 | Discharge: 2023-08-01 | Disposition: A | Discharge status: Home / Self Care | Payer: Medicare HMO | Source: Ambulatory Visit

## 2023-08-01 NOTE — Pre-Procedure Instructions (Signed)
 Patient had stress test yesterday and recommendations made to see cardiology. He is awaitng appt with [facility]. Dr Trisha Mangle aware and feels prudent to postpone procedure until pt sees cardiology. Patient is aware.

## 2023-08-05 ENCOUNTER — Encounter: Payer: Self-pay | Admitting: Cardiovascular Disease

## 2023-08-05 ENCOUNTER — Ambulatory Visit: Payer: Medicare HMO | Attending: Cardiovascular Disease | Admitting: Cardiovascular Disease

## 2023-08-05 VITALS — BP 130/82 | HR 99 | Ht 65.0 in | Wt 214.4 lb

## 2023-08-05 DIAGNOSIS — Z79899 Other long term (current) drug therapy: Secondary | ICD-10-CM

## 2023-08-05 DIAGNOSIS — R072 Precordial pain: Secondary | ICD-10-CM

## 2023-08-05 DIAGNOSIS — I10 Essential (primary) hypertension: Secondary | ICD-10-CM | POA: Diagnosis not present

## 2023-08-05 DIAGNOSIS — I429 Cardiomyopathy, unspecified: Secondary | ICD-10-CM

## 2023-08-05 DIAGNOSIS — R0602 Shortness of breath: Secondary | ICD-10-CM | POA: Diagnosis not present

## 2023-08-05 DIAGNOSIS — I519 Heart disease, unspecified: Secondary | ICD-10-CM | POA: Diagnosis not present

## 2023-08-05 MED ORDER — METOPROLOL TARTRATE 50 MG PO TABS: ORAL_TABLET | ORAL | 0 refills | Status: DC

## 2023-08-05 NOTE — Patient Instructions (Addendum)
 Medication Instructions:  No changes  If you need a refill on your cardiac medications before your next appointment, please call your pharmacy.   Lab work: Your provider would like for you to have following labs drawn today BMP.    Testing/Procedures:   Your cardiac CT will be scheduled at one of the below locations:    [facility] [address] [place], [place] [address] [phone]  OR   [facility] [address] [place], [place] [address] [phone]   If scheduled at [facility] or [facility], please arrive 15 mins early for check-in and test prep.  There is spacious parking and easy access to the radiology department from the [facility] V[facility] entrance. Please enter here and check-in with the desk attendant.   Please follow these instructions carefully (unless otherwise directed):  An IV will be required for this test and Nitroglycerin  will be given.  Hold all erectile dysfunction medications at least 3 days (72 hrs) prior to test. (Ie viagra, cialis, sildenafil, tadalafil, etc)   On the Night Before the Test: Be sure to Drink plenty of water. Do not consume any caffeinated/decaffeinated beverages or chocolate 12 hours prior to your test. Do not take any antihistamines 12 hours prior to your test.  On the Day of the Test: Drink plenty of water until 1 hour prior to the test. Do not eat any food 1 hour prior to test. You may take your regular medications prior to the test.  Take metoprolol  (Lopressor ) 50 mg the night before your test and take metoprolol  100 mg two hours prior to test.       After the Test: Drink plenty of water. After receiving IV contrast, you may experience a mild flushed feeling. This is normal. On occasion, you may experience a mild rash up to 24 hours after the test. This is not dangerous. If this occurs, you can  take Benadryl 25 mg and increase your fluid intake. If you experience trouble breathing, this can be serious. If it is severe call 911 IMMEDIATELY. If it is mild, please call our office.  We will call to schedule your test 2-4 weeks out understanding that some insurance companies will need an authorization prior to the service being performed.   For more information and frequently asked questions, please visit our website : [email or web address]  For non-scheduling related questions, please contact the cardiac imaging nurse navigator should you have any questions/concerns: Cardiac Imaging Nurse Navigators Direct Office Dial: [phone]   For scheduling needs, including cancellations and rescheduling, please call Brittany, [phone].   Follow-Up: At [facility], you and your health needs are our priority.  As part of our continuing mission to provide you with exceptional heart care, we have created designated Provider Care Teams.  These Care Teams include your primary Cardiologist (physician) and Advanced Practice Providers (APPs -  Physician Assistants and Nurse Practitioners) who all work together to provide you with the care you need, when you need it.  You will need a follow up appointment in 12 months  Providers on your designated Care Team:   Lonni Meager, NP Bernardino Bring, PA-C Cadence [place]ster, [place]  COVID-19 Vaccine Information can be found at: [email or web address] For questions related to vaccine distribution or appointments, please email [email or web address] or call [phone].

## 2023-08-05 NOTE — Progress Notes (Signed)
 Cardiology Office Note  Date:  08/05/2023   ID:  Jorge, Diaz 25-Nov-1950, MRN [id number]  PCP:  Auston Reyes BIRCH, MD   Chief Complaint  Patient presents with   New Patient (Initial Visit)    Ref by Dr. Auston for abnormal stress Echo & shortness of breath on exertion. Patient needs to have a cardiac clearance to have a Lipoma removed; scheduled for this Friday, 08/09/2023.    HPI:  [name] is a 73 year old gentleman with past medical history of Hypertension Hyperlipidemia Prediabetes  A1C 6.1 Sleep apnea Who presents for new patient evaluation for shortness of breath, abnormal stress test  Reports that he was recently seen by primary care, reported having shortness of breath on exertion  Underwent exercise echo treadmill study Report details inferior wall hypokinesis at peak stress, positive stress test  Denies significant chest pain on exertion Did appreciate some shortness of breath on heavy exertion Recently shoveling snow, reported he was sweating more,  Rare sharp pain, only lasts a brief time  Prior records reviewed Echocardiogram June 2022 Ejection fraction 50 to 55%  Stress treadmill echo January 2025 Reported to have inferior wall hypokinesis  Lab work reviewed Total cholesterol 94 LDL 45  EKG personally reviewed by myself on todays visit EKG Interpretation Date/Time:  Monday August 05 2023 14:54:25 EST Ventricular Rate:  99 PR Interval:  168 QRS Duration:  80 QT Interval:  338 QTC Calculation: 433 R Axis:   21  Text Interpretation: Normal sinus rhythm Nonspecific ST and T wave abnormality No previous ECGs available Confirmed by Perla Lye [phone]) on 08/05/2023 3:22:33 PM    PMH:   has a past medical history of BPH (benign prostatic hyperplasia), Cancer (HCC), Dermatitis herpetiformis, Diverticulosis, Erectile dysfunction, Erythrocytosis, Esophagitis, Gastritis, Gout, Hyperglycemia, Hyperlipidemia, Hypertension, Inappropriate sinus node  tachycardia (HCC), Internal hemorrhoids, Osteoarthritis, Polycythemia, Pre-diabetes, Prediabetes, Right rotator cuff tear, and Sleep apnea.  PSH:    Past Surgical History:  Procedure Laterality Date   CARPAL TUNNEL RELEASE Bilateral    COLONOSCOPY N/A 10/04/2022   Procedure: COLONOSCOPY;  Surgeon: Onita Elspeth Sharper, DO;  Location: [facility] ENDOSCOPY;  Service: Gastroenterology;  Laterality: N/A;   COLONOSCOPY WITH ESOPHAGOGASTRODUODENOSCOPY (EGD)  03/28/2007   SHOULDER ARTHROSCOPY WITH OPEN ROTATOR CUFF REPAIR AND DISTAL CLAVICLE ACROMINECTOMY Right 06/14/2020   Procedure: RIGHT SHOULDER ARTHROSCOPY SUBACCROMINAL DECOMPRESSION, DISTAL CLAVICLE EXCISION AND MINI-OPEN ROTATOR CUFF REPAIR;  Surgeon: Marchia Drivers, MD;  Location: [facility] ORS;  Service: Orthopedics;  Laterality: Right;    Current Outpatient Medications  Medication Sig Dispense Refill   acetaminophen  (TYLENOL ) 500 MG tablet Take 500-1,000 mg by mouth every 6 (six) hours as needed for moderate pain (pain score 4-6).     allopurinol (ZYLOPRIM) 300 MG tablet Take 300 mg by mouth every morning.      amitriptyline  (ELAVIL ) 25 MG tablet Take 25 mg by mouth at bedtime.     aspirin EC 81 MG tablet Take 81 mg by mouth daily.     carvedilol  (COREG ) 6.25 MG tablet Take 6.25 mg by mouth 2 (two) times daily.      dapsone 25 MG tablet Take 25 mg by mouth daily.  0   omeprazole (PRILOSEC) 20 MG capsule Take 20 mg by mouth every morning.      No current facility-administered medications for this visit.    Allergies:   Patient has no known allergies.   Social History:  The patient  reports that he has never smoked. He has never used smokeless  tobacco. He reports current alcohol use. He reports that he does not use drugs.   Family History:   family history includes Hypertension in his father; Prostate cancer in his brother, brother, and father.    Review of Systems: Review of Systems  Constitutional: Negative.   HENT: Negative.     Respiratory:  Positive for shortness of breath.   Cardiovascular: Negative.   Gastrointestinal: Negative.   Musculoskeletal: Negative.   Neurological: Negative.   Psychiatric/Behavioral: Negative.    All other systems reviewed and are negative.    PHYSICAL EXAM: VS:  BP 130/82 (BP Location: Right Arm, Patient Position: Sitting, Cuff Size: Large)   Pulse 99   Ht 5' 5 (1.651 m)   Wt 214 lb 6 oz (97.2 kg)   SpO2 96%   BMI 35.67 kg/m  , BMI Body mass index is 35.67 kg/m. GEN: Well nourished, well developed, in no acute distress HEENT: normal Neck: no JVD, carotid bruits, or masses Cardiac: RRR; no murmurs, rubs, or gallops,no edema  Respiratory:  clear to auscultation bilaterally, normal work of breathing GI: soft, nontender, nondistended, + BS MS: no deformity or atrophy Skin: warm and dry, no rash Neuro:  Strength and sensation are intact Psych: euthymic mood, full affect  Recent Labs: No results found for requested labs within last 365 days.    Lipid Panel No results found for: CHOL, HDL, LDLCALC, TRIG    Wt Readings from Last 3 Encounters:  08/05/23 214 lb 6 oz (97.2 kg)  10/04/22 209 lb (94.8 kg)  06/14/20 200 lb (90.7 kg)       ASSESSMENT AND PLAN:  Problem List Items Addressed This Visit       Cardiology Problems   Benign essential HTN - Primary   Relevant Orders   EKG 12-Lead (Completed)   Cardiomyopathy, idiopathic (HCC)   Relevant Orders   EKG 12-Lead (Completed)   LV dysfunction   Relevant Orders   EKG 12-Lead (Completed)     Other   SOBOE (shortness of breath on exertion)   Relevant Orders   EKG 12-Lead (Completed)   Shortness of breath Long discussion concerning recent symptoms and testing done through primary care Rare chest tightness lasting several seconds, atypical in nature, reports having positive exercise echo treadmill study at [facility] with inferior wall hypokinesis Discussed for his treatment options with him  including invasive versus noninvasive study Given mild nature of his symptoms, recommended noninvasive study with cardiac CTA in effort to avoid cardiac catheterization Order placed for cardiac CT, instructions provided    Signed, Velinda Lunger, M.D., Ph.D. [facility] [place], [place] [phone]

## 2023-08-06 LAB — BASIC METABOLIC PANEL
BUN/Creatinine Ratio: 14 (ref 10–24)
BUN: 21 mg/dL (ref 8–27)
CO2: 18 mmol/L — ABNORMAL LOW (ref 20–29)
Calcium: 10.2 mg/dL (ref 8.6–10.2)
Chloride: 105 mmol/L (ref 96–106)
Creatinine, Ser: 1.45 mg/dL — ABNORMAL HIGH (ref 0.76–1.27)
Glucose: 136 mg/dL — ABNORMAL HIGH (ref 70–99)
Potassium: 4.3 mmol/L (ref 3.5–5.2)
Sodium: 145 mmol/L — ABNORMAL HIGH (ref 134–144)
eGFR: 52 mL/min/{1.73_m2} — ABNORMAL LOW (ref >59)

## 2023-08-07 ENCOUNTER — Telehealth (HOSPITAL_COMMUNITY): Payer: Self-pay | Admitting: *Deleted

## 2023-08-07 NOTE — Telephone Encounter (Signed)
 Reaching out to patient to offer assistance regarding upcoming cardiac imaging study; pt verbalizes understanding of appt date/time, parking situation and where to check in, pre-test NPO status and medications ordered, and verified current allergies; name and call back number provided for further questions should they arise Johney Frame RN Navigator Cardiac Imaging [facility] [phone] office [phone] cell

## 2023-08-08 ENCOUNTER — Ambulatory Visit
Admission: RE | Admit: 2023-08-08 | Discharge: 2023-08-08 | Disposition: A | Discharge status: Home / Self Care | Payer: Medicare HMO | Source: Ambulatory Visit | Attending: Cardiovascular Disease | Admitting: Cardiovascular Disease

## 2023-08-08 DIAGNOSIS — R072 Precordial pain: Secondary | ICD-10-CM | POA: Insufficient documentation

## 2023-08-08 MED ORDER — NITROGLYCERIN 0.4 MG SL SUBL
0.8000 mg | SUBLINGUAL_TABLET | Freq: Once | SUBLINGUAL | Status: AC
Start: 2023-08-08 — End: 2023-08-08
  Administered 2023-08-08: 0.8 mg via SUBLINGUAL

## 2023-08-08 MED ORDER — IOHEXOL 350 MG/ML SOLN
100.0000 mL | Freq: Once | INTRAVENOUS | Status: AC | PRN
  Administered 2023-08-08: 100 mL via INTRAVENOUS

## 2023-08-08 MED ORDER — METOPROLOL TARTRATE 5 MG/5ML IV SOLN
10.0000 mg | Freq: Once | INTRAVENOUS | Status: DC | PRN
Start: 2023-08-08 — End: 2023-08-09

## 2023-08-08 MED ORDER — DILTIAZEM HCL 25 MG/5ML IV SOLN: 10.0000 mg | INTRAVENOUS | Status: DC | PRN

## 2023-08-08 MED ORDER — SODIUM CHLORIDE 0.9 % IV SOLN: INTRAVENOUS | Status: DC

## 2023-08-08 NOTE — Progress Notes (Signed)
Patient tolerated CT well. Drank water after. Vital signs stable encourage to drink water throughout day.Reasons explained and verbalized understanding. Ambulated steady gait.  

## 2023-08-09 ENCOUNTER — Encounter: Payer: Self-pay | Admitting: Cardiovascular Disease

## 2023-08-15 ENCOUNTER — Encounter
Admission: RE | Admit: 2023-08-15 | Discharge: 2023-08-15 | Disposition: A | Discharge status: Home / Self Care | Payer: Medicare HMO | Source: Ambulatory Visit | Attending: General Surgery | Admitting: General Surgery

## 2023-08-15 HISTORY — DX: Benign prostatic hyperplasia without lower urinary tract symptoms: N40.0

## 2023-08-15 HISTORY — DX: Atherosclerotic heart disease of native coronary artery without angina pectoris: I25.10

## 2023-08-15 HISTORY — DX: Heart disease, unspecified: I51.9

## 2023-08-15 HISTORY — DX: Abnormal result of other cardiovascular function study: R94.39

## 2023-08-15 HISTORY — DX: Shortness of breath: R06.02

## 2023-08-15 HISTORY — DX: Cardiomyopathy, unspecified: I42.9

## 2023-08-15 HISTORY — DX: Localized swelling, mass and lump, trunk: R22.2

## 2023-08-15 NOTE — Patient Instructions (Signed)
Your procedure is scheduled on:08-23-23 Friday Report to the Registration Desk on the 1st floor of the Medical Mall.Then proceed to the 2nd floor Surgery Desk To find out your arrival time, please call [phone] between 1PM - 3PM on:08-22-23 Thursday If your arrival time is 6:00 am, do not arrive before that time as the Medical Mall entrance doors do not open until 6:00 am.  REMEMBER: Instructions that are not followed completely may result in serious medical risk, up to and including death; or upon the discretion of your surgeon and anesthesiologist your surgery may need to be rescheduled.  Do not eat food OR drink any liquids after midnight the night before surgery.  No gum chewing or hard candies.  One week prior to surgery:Stop NOW (08-15-23) Stop Anti-inflammatories (NSAIDS) such as Advil, Aleve, Ibuprofen, Motrin, Naproxen, Naprosyn and Aspirin based products such as Excedrin, Goody's Powder, BC Powder. Stop ANY OVER THE COUNTER supplements until after surgery.  You may however, continue to take Tylenol if needed for pain up until the day of surgery.  Stop 81 mg Aspirin as Dr Hazle Quant instructed you 7 days prior to surgery-Last dose will be today (08-15-23)  Continue taking all of your other prescription medications up until the day of surgery.  ON THE DAY OF SURGERY ONLY TAKE THESE MEDICATIONS WITH SIPS OF WATER: -allopurinol (ZYLOPRIM)  -carvedilol (COREG)  -omeprazole (PRILOSEC)   No Alcohol for 24 hours before or after surgery.  No Smoking including e-cigarettes for 24 hours before surgery.  No chewable tobacco products for at least 6 hours before surgery.  No nicotine patches on the day of surgery.  Do not use any "recreational" drugs for at least a week (preferably 2 weeks) before your surgery.  Please be advised that the combination of cocaine and anesthesia may have negative outcomes, up to and including death. If you test positive for cocaine, your surgery will  be cancelled.  On the morning of surgery brush your teeth with toothpaste and water, you may rinse your mouth with mouthwash if you wish. Do not swallow any toothpaste or mouthwash.  Use Antibacterial Soap Bar  the night before surgery and again the morning of surgery (this is a bar patient had from a previous surgery)  Do not wear jewelry, make-up, hairpins, clips or nail polish.  For welded (permanent) jewelry: bracelets, anklets, waist bands, etc.  Please have this removed prior to surgery.  If it is not removed, there is a chance that hospital personnel will need to cut it off on the day of surgery.  Do not wear lotions, powders, or perfumes.   Do not shave body hair from the neck down 48 hours before surgery.  Contact lenses, hearing aids and dentures may not be worn into surgery.  Do not bring valuables to the hospital. [facility] is not responsible for any missing/lost belongings or valuables.   Notify your doctor if there is any change in your medical condition (cold, fever, infection).  Wear comfortable clothing (specific to your surgery type) to the hospital.  After surgery, you can help prevent lung complications by doing breathing exercises.  Take deep breaths and cough every 1-2 hours. Your doctor may order a device called an Incentive Spirometer to help you take deep breaths. When coughing or sneezing, hold a pillow firmly against your incision with both hands. This is called "splinting." Doing this helps protect your incision. It also decreases belly discomfort.  If you are being admitted to the hospital overnight,  leave your suitcase in the car. After surgery it may be brought to your room.  In case of increased patient census, it may be necessary for you, the patient, to continue your postoperative care in the Same Day Surgery department.  If you are being discharged the day of surgery, you will not be allowed to drive home. You will need a responsible individual to  drive you home and stay with you for 24 hours after surgery.   If you are taking public transportation, you will need to have a responsible individual with you.  Please call the Pre-admissions Testing Dept. at [phone] if you have any questions about these instructions.  Surgery Visitation Policy:  Patients having surgery or a procedure may have two visitors.  Children under the age of 37 must have an adult with them who is not the patient.  Temporary Visitor Restrictions Due to increasing cases of flu, RSV and COVID-19: Children ages 49 and under will not be able to visit patients in [facility] hospitals under most circumstances.

## 2023-08-16 ENCOUNTER — Telehealth: Payer: Self-pay | Admitting: Cardiovascular Disease

## 2023-08-16 NOTE — Telephone Encounter (Signed)
Received stat call from [facility] from radiology. French Ana called with the following CTA results.  1. Questionable peripheral ground-glass nodule in the right lower lobe measuring 7 mm. This should be further evaluated with follow-up chest CT.  Dr. Mariah Milling made aware of results.

## 2023-08-16 NOTE — Telephone Encounter (Signed)
Jorge Diaz is calling to give CT result

## 2023-08-19 ENCOUNTER — Telehealth: Payer: Self-pay | Admitting: Cardiovascular Disease

## 2023-08-19 NOTE — Telephone Encounter (Signed)
   Patient Name: Jorge Diaz  DOB: 11/04/1950 MRN: [id number]  Primary Cardiologist: None  Clearance for surgery previously communicated with Quentin Mulling, NP. Dr. Mariah Milling has reviewed his coronary CTA and notes he can proceed with his surgery at acceptable risk. ASA can be held for 5-7 days prior to his procedure.   I will route this recommendation to the requesting party via Epic fax function and remove from pre-op pool.  Please call with questions.  Tereso Newcomer, PA-C 08/19/2023, 5:31 PM

## 2023-08-19 NOTE — Telephone Encounter (Signed)
   Pre-operative Risk Assessment    Patient Name: Jorge Diaz  DOB: [birth year] MRN: [id number]   Date of last office visit: 08/05/23 Date of next office visit: n/a   Request for Surgical Clearance    Procedure:  Excision of Lipoma  Date of Surgery:  Clearance 08/23/23                                Surgeon:  Dr. Maia Plan Surgeon's Group or Practice Name:  [facility] Clearance Phone number:  [phone] Fax number:  [phone]   Type of Clearance Requested:  Hold Aspirin 5 days    Type of Anesthesia:  Not Indicated   Additional requests/questions:    Signed, Narda Amber   08/19/2023, 4:40 PM

## 2023-08-19 NOTE — Telephone Encounter (Signed)
Notes faxed to surgeon. This phone note will be removed from the preop pool. Tereso Newcomer, PA-C  08/19/2023 5:33 PM

## 2023-08-21 ENCOUNTER — Encounter: Payer: Self-pay | Admitting: General Surgery

## 2023-08-21 DIAGNOSIS — Z7982 Long term (current) use of aspirin: Secondary | ICD-10-CM

## 2023-08-21 NOTE — Progress Notes (Signed)
Perioperative / Anesthesia Services  Pre-Admission Testing Clinical Review / Pre-Operative Anesthesia Consult  Date: 08/21/23  Patient Demographics:  Name: Jorge Diaz DOB: [birth year] MRN:   [id number]  Planned Surgical Procedure(s):    Case: [id number] Date/Time: 08/23/23 1105   Procedure: EXCISION LIPOMA (Back)   Anesthesia type: Monitor Anesthesia Care   Pre-op diagnosis: R22.2 Mass of skin on back   Location: [facility] OR ROOM 05 / [facility]   Surgeons: Carolan Shiver, MD      NOTE: Available PAT nursing documentation and vital signs have been reviewed. Clinical nursing staff has updated patient's PMH/PSHx, current medication list, and drug allergies/intolerances to ensure comprehensive history available to assist in medical decision making as it pertains to the aforementioned surgical procedure and anticipated anesthetic course. Extensive review of available clinical information personally performed. [place] [facility] and PSHx updated with any diagnoses/procedures that  may have been inadvertently omitted during his intake with the pre-admission testing department's nursing staff.  Clinical Discussion:  Jorge Diaz is a 73 y.o. male who is submitted for pre-surgical anesthesia review and clearance prior to him undergoing the above procedure. Patient has never been a smoker in the past. Pertinent PMH includes: CAD, idiopathic cardiomyopathy, diastolic dysfunction, HTN, HLD, prediabetes, inappropriate sinus tachycardia, OSAH (no nocturnal PAP therapy), GERD/gastritis/esophagitis (on daily PPI), polycythemia vera, lipoma of back, OA, BPH.  Patient is followed by cardiology Mariah Milling, MD). He was last seen in the cardiology clinic on 08/05/2023; notes reviewed. At the time of his clinic visit, patient experiencing exertional shortness of breath and rare episodes of self-limiting chest tightness. This was further evaluated by via noninvasive study ordered by his PCP. Testing  was abnormal and patient was referred to [facility] for further testing. During his appointment, patient denied any chest pain, PND, orthopnea, palpitations, significant peripheral edema, weakness, fatigue, vertiginous symptoms, or presyncope/syncope. Patient with a past medical history significant for cardiovascular diagnoses. Documented physical exam was grossly benign, providing no evidence of acute exacerbation and/or decompensation of the patient's known cardiovascular conditions.  Stress echocardiogram was performed on 07/31/2023. Study results revealed a normal left ventricular systolic function with an EF of >55%. There was hypokinesis of the inferior wall following stress. Patient was able to achieve a total of 10.1 METS follow 8 minutes and 7 seconds of stress. Study indicated a moderately decreased (20-30%) functional capacity. Patient achieved a maximum heart rate of 136 bpm (MPHR 128 bpm). During/following stress, there was ST segment depression noted in the inferior leads. Trivial pulmonary and tricuspid valve regurgitation was observed. All transvalvular gradients were normal with no evidence of valvular stenosis. Given the findings, patient was referred to cardiology at [facility] for further evaluation and testing.   Blood pressure reasonably controlled at 130/82 mmHg on currently prescribed beta-blocker (carvedilol) monotherapy. Patient is not on any type of lipid lower therapy for his for his HLD diagnosis and further ASCVD prevention; coronary calcium score relatively low.Patient has a signosis of prediabetes; most recent HgbA1c was 6.1% when checked on 07/02/2023.  He does have an OSAH diagnosis, however does not require the use of nocturnal PAP therapy. Patient is able to complete all of his  ADL/IADLs without cardiovascular limitation.  Per the [facility], patient is able to achieve at least 4 METS of physical activity without experiencing any significant degree of angina/anginal equivalent symptoms. Given  symptom constellation, patient was sent for further non-invasive imaging by way of cardiac CTA. No changes were made to his medication regimen during  his visit with cardiology.  Patient scheduled to follow-up with outpatient cardiology in 1 year or sooner if needed.  Since patient was seen in the cardiology office by Dr. Mariah Milling, he has undergone the recommended cardiovascular testing.   Coronary CTA was performed on 08/08/2023 that demonstrated an Agatston coronary artery calcium score of 35.2. This placed patient in the 29th percentile for age, sex, and race matched controls. Minimal calcium depositions (<25%) noted to be isolated mainly in the pLAD distribution.  Study demonstrates normal coronary origin with RIGHT dominance. Following testing, cardiology deemed prior stress testing to be falsely positive.   Jorge Diaz is scheduled for an elective EXCISION LIPOMA (Back) on 08/23/2023 with Dr. Carolan Shiver, MD. Given patient's past medical history significant for cardiovascular diagnoses, presurgical cardiac clearance was sought by the PAT team. Per cardiology, "based ACC/AHA guidelines, the patient's past medical history, and the amount of time since his last clinic visit, this patient would be at an overall ACCEPTABLE risk for the planned procedure without further cardiovascular testing or intervention at this time".   In review of the patient's chart, it is noted that he is on daily oral antithrombotic therapy. He has been instructed on recommendations for holding his daily low dose ASA for 7 days prior to his procedure with plans to restart as soon as postoperative bleeding risk felt to be minimized by his attending surgeon. The patient has been instructed that his last dose of ASA should be on 08/15/2023.  Patient denies previous perioperative complications with anesthesia in the past. In review his EMR, it is noted that patient underwent a general anesthetic course here at [facility] ([facility] II) in 09/2022 without documented complications.      08/08/2023    3:05 PM 08/08/2023    2:30 PM 08/05/2023    2:51 PM  Vitals with BMI  Systolic 114 143 161  Diastolic 77 90 82  Pulse 67 62 99   Providers/Specialists:  NOTE: Primary physician provider listed below. Patient may have been seen by APP or partner within same practice.   PROVIDER ROLE / SPECIALTY LAST Beverely Pace, MD General Surgery (Surgeon) 07/18/2023  Marguarite Arbour, MD Primary Care Provider 07/02/2023  Julien Nordmann, MD Cardiology 08/05/2023   Allergies:  No Known Allergies Current Home Medications:   No current facility-administered medications for this encounter.    acetaminophen (TYLENOL) 500 MG tablet   allopurinol (ZYLOPRIM) 300 MG tablet   amitriptyline (ELAVIL) 25 MG tablet   aspirin EC 81 MG tablet   carvedilol (COREG) 6.25 MG tablet   dapsone 25 MG tablet   omeprazole (PRILOSEC) 20 MG capsule   History:   Past Medical History:  Diagnosis Date   Basal cell carcinoma of skin    Benign fibroma of prostate    BPH (benign prostatic hyperplasia)    Cholelithiasis    Coronary artery disease    a.) cCTA 08/08/2023: Ca2+ 35.2 (29th %ile; < 25% pLAD) --> prior stress echo deemed to be a false (+)   Dermatitis herpetiformis    Diastolic dysfunction 06/06/2016   a.) TTE 06/06/2016: EF 45%, glob HK, G1DD. mild L:AE, norm RVSF, triv panvalvular regurg; b.) TTE 12/28/2020: EF 50%, no RWMAs, G1DD, mild LAE, norm RVSF, mild MR/TR/PR; c.) stress TTE 07/31/2023: EF >55%, no RWMAs, G1DD, max HR 136 bpm (MPHR 128 bpm), mod decreased ETT (20-30%), inferolat ST depression, mild AR/TR/PR   Diverticulosis    Erectile dysfunction  Erythrocytosis    Esophagitis    Fatty infiltration of liver    Gastritis    GERD (gastroesophageal reflux disease)    Gout    Hyperlipidemia    Hypertension    Idiopathic cardiomyopathy (HCC) 06/06/2016   a.) TTE 06/06/2016:  EF 45%; b.) TTE 12/28/2020: EF 50%; c.) stress TTE 07/31/2023: EF >55%   Inappropriate sinus node tachycardia (HCC)    Internal hemorrhoids    Lipoma of back    Long-term use of aspirin therapy    Melanoma of skin (HCC)    Osteoarthritis    Polycythemia vera (HCC)    a.) Tx'd with daily dapsone   Pre-diabetes    Right rotator cuff tear    Shortness of breath on exertion    Sleep apnea    a.) no nocturnal PAP therapy   Past Surgical History:  Procedure Laterality Date   CARPAL TUNNEL RELEASE Bilateral    COLONOSCOPY N/A 10/04/2022   Procedure: COLONOSCOPY;  Surgeon: Jaynie Collins, DO;  Location: [facility] ENDOSCOPY;  Service: Gastroenterology;  Laterality: N/A;   COLONOSCOPY WITH ESOPHAGOGASTRODUODENOSCOPY (EGD)  03/28/2007   SHOULDER ARTHROSCOPY WITH OPEN ROTATOR CUFF REPAIR AND DISTAL CLAVICLE ACROMINECTOMY Right 06/14/2020   Procedure: RIGHT SHOULDER ARTHROSCOPY SUBACCROMINAL DECOMPRESSION, DISTAL CLAVICLE EXCISION AND MINI-OPEN ROTATOR CUFF REPAIR;  Surgeon: Juanell Fairly, MD;  Location: [facility] ORS;  Service: Orthopedics;  Laterality: Right;   Family History  Problem Relation Age of Onset   Hypertension Father    Prostate cancer Father    Prostate cancer Brother    Prostate cancer Brother    Social History   Tobacco Use   Smoking status: Never   Smokeless tobacco: Never  Substance Use Topics   Alcohol use: Yes    Comment: Drinks 6 or 8 beers on the weekend   Pertinent Clinical Results:  LABS:    Component Ref Range & Units 07/02/2023  WBC (White Blood Cell Count) 4.1 - 10.2 10^3/uL 6.9  RBC (Red Blood Cell Count) 4.69 - 6.13 10^6/uL 5.11  Hemoglobin 14.1 - 18.1 gm/dL 16.1  Hematocrit 09.6 - 52.0 % 47.4  MCV (Mean Corpuscular Volume) 80.0 - 100.0 fl 92.8  MCH (Mean Corpuscular Hemoglobin) 27.0 - 31.2 pg 31.5 High   MCHC (Mean Corpuscular Hemoglobin Concentration) 32.0 - 36.0 gm/dL 34  Platelet Count 045 - 450 10^3/uL 209  RDW-CV (Red Cell Distribution  Width) 11.6 - 14.8 % 12.9  MPV (Mean Platelet Volume) 9.4 - 12.4 fl 10.2  Neutrophils 1.50 - 7.80 10^3/uL 3.96  Lymphocytes 1.00 - 3.60 10^3/uL 2.07  Monocytes 0.00 - 1.50 10^3/uL 0.48  Eosinophils 0.00 - 0.55 10^3/uL 0.23  Basophils 0.00 - 0.09 10^3/uL 0.09  Neutrophil % 32.0 - 70.0 % 57.7  Lymphocyte % 10.0 - 50.0 % 30.2  Monocyte % 4.0 - 13.0 % 7  Eosinophil % 1.0 - 5.0 % 3.4  Basophil% 0.0 - 2.0 % 1.3  Immature Granulocyte % <=0.7 % 0.4  Immature Granulocyte Count <=0.06 10^3/L 0.03  Resulting Agency [facility]  Specimen Collected: 07/02/23 10:01   Performed by: [facility] Last Resulted: 07/02/23 10:27  Received From: [facility]  Result Received: 07/18/23 13:08    Lab Results  Component Value Date   NA 145 (H) 08/05/2023   K 4.3 08/05/2023   CO2 18 (L) 08/05/2023   GLUCOSE 136 (H) 08/05/2023   BUN 21 08/05/2023   CREATININE 1.45 (H) 08/05/2023   CALCIUM 10.2 08/05/2023  EGFR 52 (L) 08/05/2023   GFRNONAA >60 06/13/2020     ECG: Date: 08/05/2023  Time ECG obtained: 1454 PM Rate: 99 bpm Rhythm: normal sinus Axis (leads I and aVF): normal Intervals: PR 168 ms. QRS 80 ms. QTc 433 ms. ST segment and T wave changes: Nonspecific ST and T wave abnormalities  Comparison: Similar to previous tracing obtained on 06/13/2020   IMAGING / PROCEDURES: CT CORONARY MORPH W/CTA COR W/SCORE W/CA W/CM &/OR WO/CM performed on 08/08/2023 Questionable peripheral ground-glass nodule in the right lower lobe measuring 7 mm. This should be further evaluated with follow-up chest CT. Fatty infiltration of the liver. Coronary calcium score of 35.2. This was 29th percentile for age and sex matched control. Normal coronary origin with right dominance. Minimal proximal LAD stenosis (<25%). CAD-RADS 1. Minimal non-obstructive CAD (0-24%). Consider preventive therapy and risk factor modification.  STRESS ECHOCARDIOGRAM performed  on 07/31/2023 Total stress duration was 8 minutes and 7 seconds Maximum HR was 136 bpm (MPHR 136 bpm) Achieved a total of 10.1 METS Moderately reduced functional capacity (20-30%) Normal left ventricular systolic dysfunction with an EF of > 55%. Inferior wall hypokinesis post stress. Left ventricular diastolic Doppler parameters consistent with abnormal relaxation (G1DD). Inferolateral ST depression Trivial pulmonary and tricuspid valve regurgitation Abnormal - further evaluation recommended  TRANSTHORACIC ECHOCARDIOGRAM performed on 12/28/2020 Low normal left ventricular systolic function with an EF of 50% Normal left ventricular diastolic Doppler parameters Left ventricular diastolic Doppler parameters consistent with abnormal relaxation (G1DD). Left atrium is mildly enlarged.  Trivial mitral and tricuspid valve regurgitation Normal transvalvular gradients; no valvular stenosis No pericardial effusion  Impression and Plan:  Jorge Diaz has been referred for pre-anesthesia review and clearance prior to him undergoing the planned anesthetic and procedural courses. Available labs, pertinent testing, and imaging results were personally reviewed by me in preparation for upcoming operative/procedural course. [facility] medical record has been updated following extensive record review and patient interview with PAT staff.   This patient has been appropriately cleared by cardiology with an overall ACCEPTABLE risk of experiencing significant perioperative cardiovascular complications. Based on clinical review performed today (08/21/23), barring any significant acute changes in the patient's overall condition, it is anticipated that he will be able to proceed with the planned surgical intervention. Any acute changes in clinical condition may necessitate his procedure being postponed and/or cancelled. Patient will meet with anesthesia team (MD and/or CRNA) on the day of his procedure for preoperative  evaluation/assessment. Questions regarding anesthetic course will be fielded at that time.   Pre-surgical instructions were reviewed with the patient during his PAT appointment, and questions were fielded to satisfaction by PAT clinical staff. He has been instructed on which medications that he will need to hold prior to surgery, as well as the ones that have been deemed safe/appropriate to take on the day of his procedure. As part of the general education provided by PAT, patient made aware both verbally and in writing, that he would need to abstain from the use of any illegal substances during his perioperative course. He was advised that failure to follow the provided instructions could necessitate case cancellation or result in serious perioperative complications up to and including death. Patient encouraged to contact PAT and/or his surgeon's office to discuss any questions or concerns that may arise prior to surgery; verbalized understanding.   Quentin Mulling, MSN, APRN, FNP-C, CEN [facility]  Perioperative Services Nurse Practitioner Phone: [phone] Fax: [phone] 08/21/23 4:17 PM  NOTE: This note has been prepared using Scientist, clinical (histocompatibility and immunogenetics). Despite my best ability to proofread, there is always the potential that unintentional transcriptional errors may still occur from this process.

## 2023-08-23 ENCOUNTER — Ambulatory Visit: Payer: Medicare HMO | Admitting: Urgent Care

## 2023-08-23 ENCOUNTER — Other Ambulatory Visit: Payer: Self-pay

## 2023-08-23 ENCOUNTER — Encounter
Admission: RE | Disposition: A | Discharge status: Home / Self Care | Payer: Self-pay | Source: Ambulatory Visit | Attending: General Surgery

## 2023-08-23 ENCOUNTER — Ambulatory Visit
Admission: RE | Admit: 2023-08-23 | Discharge: 2023-08-23 | Disposition: A | Discharge status: Home / Self Care | Payer: Medicare HMO | Source: Ambulatory Visit | Attending: General Surgery | Admitting: General Surgery

## 2023-08-23 ENCOUNTER — Encounter: Payer: Self-pay | Admitting: General Surgery

## 2023-08-23 DIAGNOSIS — G4733 Obstructive sleep apnea (adult) (pediatric): Secondary | ICD-10-CM | POA: Diagnosis not present

## 2023-08-23 DIAGNOSIS — I2510 Atherosclerotic heart disease of native coronary artery without angina pectoris: Secondary | ICD-10-CM | POA: Diagnosis not present

## 2023-08-23 DIAGNOSIS — I10 Essential (primary) hypertension: Secondary | ICD-10-CM | POA: Insufficient documentation

## 2023-08-23 DIAGNOSIS — E785 Hyperlipidemia, unspecified: Secondary | ICD-10-CM | POA: Insufficient documentation

## 2023-08-23 DIAGNOSIS — K219 Gastro-esophageal reflux disease without esophagitis: Secondary | ICD-10-CM | POA: Insufficient documentation

## 2023-08-23 DIAGNOSIS — Z79899 Other long term (current) drug therapy: Secondary | ICD-10-CM | POA: Insufficient documentation

## 2023-08-23 DIAGNOSIS — Z7982 Long term (current) use of aspirin: Secondary | ICD-10-CM

## 2023-08-23 DIAGNOSIS — D171 Benign lipomatous neoplasm of skin and subcutaneous tissue of trunk: Secondary | ICD-10-CM | POA: Insufficient documentation

## 2023-08-23 HISTORY — DX: Polycythemia vera: D45

## 2023-08-23 HISTORY — DX: Long term (current) use of aspirin: Z79.82

## 2023-08-23 HISTORY — DX: Calculus of gallbladder without cholecystitis without obstruction: K80.20

## 2023-08-23 HISTORY — DX: Basal cell carcinoma of skin, unspecified: C44.91

## 2023-08-23 HISTORY — DX: Fatty (change of) liver, not elsewhere classified: K76.0

## 2023-08-23 HISTORY — DX: Gastro-esophageal reflux disease without esophagitis: K21.9

## 2023-08-23 HISTORY — DX: Malignant melanoma of skin, unspecified: C43.9

## 2023-08-23 HISTORY — PX: LIPOMA EXCISION: SHX5283

## 2023-08-23 HISTORY — DX: Benign lipomatous neoplasm of skin and subcutaneous tissue of trunk: D17.1

## 2023-08-23 SURGERY — EXCISION LIPOMA
Anesthesia: General | Site: Back | Laterality: Left | Wound class: Clean

## 2023-08-23 MED ORDER — ORAL CARE MOUTH RINSE: 15.0000 mL | Freq: Once | OROMUCOSAL | Status: AC

## 2023-08-23 MED ORDER — HYDROCODONE-ACETAMINOPHEN 5-325 MG PO TABS: 1.0000 | ORAL_TABLET | ORAL | 0 refills | Status: AC | PRN

## 2023-08-23 MED ORDER — FENTANYL CITRATE (PF) 100 MCG/2ML IJ SOLN
INTRAMUSCULAR | Status: AC
  Filled 2023-08-23: qty 2

## 2023-08-23 MED ORDER — CHLORHEXIDINE GLUCONATE 0.12 % MT SOLN
OROMUCOSAL | Status: AC
  Filled 2023-08-23: qty 15

## 2023-08-23 MED ORDER — ACETAMINOPHEN 10 MG/ML IV SOLN
INTRAVENOUS | Status: AC
  Filled 2023-08-23: qty 100

## 2023-08-23 MED ORDER — MIDAZOLAM HCL 2 MG/2ML IJ SOLN
INTRAMUSCULAR | Status: DC | PRN
  Administered 2023-08-23: 2 mg via INTRAVENOUS

## 2023-08-23 MED ORDER — ACETAMINOPHEN 10 MG/ML IV SOLN
INTRAVENOUS | Status: DC | PRN
  Administered 2023-08-23: 1000 mg via INTRAVENOUS

## 2023-08-23 MED ORDER — BUPIVACAINE-EPINEPHRINE 0.5% -1:200000 IJ SOLN
INTRAMUSCULAR | Status: DC | PRN
  Administered 2023-08-23: 30 mL

## 2023-08-23 MED ORDER — CEFAZOLIN SODIUM-DEXTROSE 2-4 GM/100ML-% IV SOLN
2.0000 g | INTRAVENOUS | Status: AC
  Administered 2023-08-23: 2 g via INTRAVENOUS

## 2023-08-23 MED ORDER — DEXMEDETOMIDINE HCL IN NACL 80 MCG/20ML IV SOLN
INTRAVENOUS | Status: DC | PRN
  Administered 2023-08-23: 8 ug via INTRAVENOUS

## 2023-08-23 MED ORDER — CHLORHEXIDINE GLUCONATE 0.12 % MT SOLN
15.0000 mL | Freq: Once | OROMUCOSAL | Status: AC
Start: 2023-08-23 — End: 2023-08-23
  Administered 2023-08-23: 15 mL via OROMUCOSAL

## 2023-08-23 MED ORDER — CEFAZOLIN SODIUM-DEXTROSE 2-4 GM/100ML-% IV SOLN
INTRAVENOUS | Status: AC
  Filled 2023-08-23: qty 100

## 2023-08-23 MED ORDER — PROPOFOL 1000 MG/100ML IV EMUL
INTRAVENOUS | Status: AC
  Filled 2023-08-23: qty 100

## 2023-08-23 MED ORDER — PROPOFOL 500 MG/50ML IV EMUL
INTRAVENOUS | Status: DC | PRN
  Administered 2023-08-23: 100 ug/kg/min via INTRAVENOUS

## 2023-08-23 MED ORDER — LACTATED RINGERS IV SOLN: INTRAVENOUS | Status: DC

## 2023-08-23 MED ORDER — FENTANYL CITRATE (PF) 100 MCG/2ML IJ SOLN
INTRAMUSCULAR | Status: DC | PRN
  Administered 2023-08-23 (×3): 25 ug via INTRAVENOUS

## 2023-08-23 MED ORDER — MIDAZOLAM HCL 2 MG/2ML IJ SOLN
INTRAMUSCULAR | Status: AC
  Filled 2023-08-23: qty 2

## 2023-08-23 MED ORDER — BUPIVACAINE-EPINEPHRINE (PF) 0.5% -1:200000 IJ SOLN
INTRAMUSCULAR | Status: AC
  Filled 2023-08-23: qty 30

## 2023-08-23 SURGICAL SUPPLY — 24 items
CHLORAPREP W/TINT 26 (MISCELLANEOUS) ×1 IMPLANT
DERMABOND ADVANCED .7 DNX12 (GAUZE/BANDAGES/DRESSINGS) ×1 IMPLANT
DRAPE LAPAROTOMY 100X77 ABD (DRAPES) ×1 IMPLANT
ELECT CAUTERY BLADE 6.4 (BLADE) ×1 IMPLANT
ELECT REM PT RETURN 9FT ADLT (ELECTROSURGICAL) ×1
ELECTRODE REM PT RTRN 9FT ADLT (ELECTROSURGICAL) ×1 IMPLANT
GLOVE BIO SURGEON STRL SZ 6.5 (GLOVE) ×1 IMPLANT
GLOVE BIOGEL PI IND STRL 6.5 (GLOVE) ×1 IMPLANT
GOWN STRL REUS W/ TWL LRG LVL3 (GOWN DISPOSABLE) ×2 IMPLANT
KIT TURNOVER KIT A (KITS) ×1 IMPLANT
LABEL OR SOLS (LABEL) ×1 IMPLANT
MANIFOLD NEPTUNE II (INSTRUMENTS) ×1 IMPLANT
NDL HYPO 25X1 1.5 SAFETY (NEEDLE) ×1 IMPLANT
NEEDLE HYPO 25X1 1.5 SAFETY (NEEDLE) ×1
NS IRRIG 500ML POUR BTL (IV SOLUTION) ×1 IMPLANT
PACK BASIN MINOR ARMC (MISCELLANEOUS) ×1 IMPLANT
SUT ETHILON 3-0 (SUTURE) IMPLANT
SUT MNCRL 4-0 27 PS-2 XMFL (SUTURE) ×1
SUT VIC AB 2-0 CT1 (SUTURE) IMPLANT
SUT VIC AB 3-0 SH 27X BRD (SUTURE) ×1 IMPLANT
SUTURE MNCRL 4-0 27XMF (SUTURE) ×1 IMPLANT
SYR 10ML LL (SYRINGE) ×1 IMPLANT
TRAP FLUID SMOKE EVACUATOR (MISCELLANEOUS) ×1 IMPLANT
WATER STERILE IRR 500ML POUR (IV SOLUTION) ×1 IMPLANT

## 2023-08-23 NOTE — H&P (Signed)
History of Present Illness (HPI)  73 year old male patient comes for evaluation of lipoma of the back, referred by Dr. Judithann Sheen. The patient stated that the lipoma has been present for several years without any associated symptoms such as pain or discomfort. The mass has not been previously checked prior to this visit. The patient has not felt the mass without being told about it. There is no pain radiation. There is no alleviating or aggravating factors.   Pertinent Positives  Lipoma present for several years Mass is 10-11 cm in size Pertinent Negatives  No pain No previous infections No redness in the area No changes on the skin No obstruction to arm movement Review of Systems Default  Constitutional - Negative for fever, chills HENT - Negative for ear pain, sore throat Eyes - Negative for pain and visual disturbance. Respiratory - Negative for cough, chest tightness and shortness of breath. Cardiovascular - Negative for chest pain, palpitations nor leg swelling. Gastrointestinal - Negative for nausea, vomiting, abdominal pain nor diarrhea. Endocrine - Negative for polydipsia nor polyuria. Genitourinary - Negative for dysuria nor hematuria. Musculoskeletal - 10-11 cm mass on left upper back, no noticeable pain Skin - 10-11 cm mass on left upper back, no changes on the skin Neurological - Negative for dizziness, seizures, focal weakness and headaches. Psychiatric/Behavioral - Negative for behavioral problems, confusion and agitation. No suicidal ideation, no homicidal ideation, no hallucinations, no delusions. Physical Exam Default  Abdomen - Bowel sounds normal, no tenderness, organomegaly, masses, or hernia. Extremities - No amputations or deformities, cyanosis, edema or varicosities, peripheral pulses intact. Eyes - Conjunctiva clear, sclera non-icteric, EOM intact, no exudates or hemorrhages General - Well appearing, well nourished, in no distress. Oriented x 3, normal mood and  affect. Ambulating without difficulty. Head - Normocephalic, atraumatic, no visible or palpable masses, depressions, or scaring. Heart - No cardiomegaly; regular rate and rhythm, no murmur or gallop. Integumentary - 10-11 cm mass on left upper back, no noticeable pain or skin changes Lungs - Clear to auscultation and percussion. Mouth - Mucous membranes moist, no mucosal lesions. Musculoskeletal - Normal gait and station. No misalignment, asymmetry, crepitation, defects, tenderness, masses, effusions, decreased range of motion, instability, atrophy or abnormal strength or tone in the head, neck, spine, ribs, pelvis or extremities. Neck - Supple, without lesions, bruits, or adenopathy, thyroid non-enlarged and non-tender. Neurologic - Sensation to pain, touch, and proprioception normal. Psychiatric - Oriented X3, intact recent and remote memory, judgment and insight, normal mood and affect. Rectal - Normal sphincter tone, no hemorrhoids or masses palpable. Skin - Good turgor, no rash, unusual bruising or prominent lesions. Medical History  Hyperlipidemia Obstructive sleep apnea Prediabetes Gastritis Medications  Aspirin - 81mg  daily Carvedilol - 6.25mg  twice a day Dapsone - 25mg  once daily Omeprazole - 20mg  daily Assessment and Plan  Soft tissue mass of back- Mass is most likely benign, such as lipoma and might keep growing; options are to monitor or surgically excise. Due to its size, surgery is indicated in an operating room with sedation.   We discussed about surgical intervention of excision of the soft tissue mass. We discussed about the risk including bleeding, infection, chronic pain, seroma, among others. Patient and wife report they understood and agreed with plan.  Carolan Shiver, MD, FACS

## 2023-08-23 NOTE — Anesthesia Preprocedure Evaluation (Addendum)
Anesthesia Evaluation  Patient identified by MRN, date of birth, ID band Patient awake    Reviewed: Allergy & Precautions, NPO status , Patient's Chart, lab work & pertinent test results  History of Anesthesia Complications Negative for: history of anesthetic complications  Airway Mallampati: III  TM Distance: >3 FB Neck ROM: Full    Dental no notable dental hx. (+) Teeth Intact   Pulmonary sleep apnea , neg COPD, Patient abstained from smoking.Not current smoker Does not tolerate cpap   Pulmonary exam normal breath sounds clear to auscultation       Cardiovascular Exercise Tolerance: Good METShypertension, Pt. on medications (-) CAD and (-) Past MI (-) dysrhythmias  Rhythm:Regular Rate:Normal - Systolic murmurs  Stress echocardiogram was performed on 07/31/2023. Study results revealed a normal left ventricular systolic function with an EF of >55%. There was hypokinesis of the inferior wall following stress. Patient was able to achieve a total of 10.1 METS follow 8 minutes and 7 seconds of stress. Study indicated a moderately decreased (20-30%) functional capacity. Patient achieved a maximum heart rate of 136 bpm (MPHR 128 bpm). During/following stress, there was ST segment depression noted in the inferior leads. Trivial pulmonary and tricuspid valve regurgitation was observed. All transvalvular gradients were normal with no evidence of valvular stenosis. Given the findings, patient was referred to cardiology at [facility] for further evaluation and testing.    Blood pressure reasonably controlled at 130/82 mmHg on currently prescribed beta-blocker (carvedilol) monotherapy. Patient is not on any type of lipid lower therapy for his for his HLD diagnosis and further ASCVD prevention; coronary calcium score relatively low.Patient has a signosis of prediabetes; most recent HgbA1c was 6.1% when checked on 07/02/2023.  He does have an OSAH diagnosis,  however does not require the use of nocturnal PAP therapy. Patient is able to complete all of his  ADL/IADLs without cardiovascular limitation.  Per the [facility], patient is able to achieve at least 4 METS of physical activity without experiencing any significant degree of angina/anginal equivalent symptoms. Given symptom constellation, patient was sent for further non-invasive imaging by way of cardiac CTA. No changes were made to his medication regimen during his visit with cardiology.  Patient scheduled to follow-up with outpatient cardiology in 1 year or sooner if needed.   Since patient was seen in the cardiology office by Dr. Mariah Milling, he has undergone the recommended cardiovascular testing.     Coronary CTA was performed on 08/08/2023 that demonstrated an Agatston coronary artery calcium score of 35.2. This placed patient in the 29th percentile for age, sex, and race matched controls. Minimal calcium depositions (<25%) noted to be isolated mainly in the pLAD distribution.  Study demonstrates normal coronary origin with RIGHT dominance. Following testing, cardiology deemed prior stress testing to be falsely positive.     Neuro/Psych negative neurological ROS  negative psych ROS   GI/Hepatic ,GERD  Controlled and Medicated,,(+)     (-) substance abuse    Endo/Other  neg diabetes    Renal/GU negative Renal ROS     Musculoskeletal   Abdominal  (+) + obese  Peds  Hematology   Anesthesia Other Findings Past Medical History: No date: Basal cell carcinoma of skin No date: Benign fibroma of prostate No date: BPH (benign prostatic hyperplasia) No date: Cholelithiasis No date: Coronary artery disease     Comment:  a.) cCTA 08/08/2023: Ca2+ 35.2 (29th %ile; < 25% pLAD)               -->  prior stress echo deemed to be a false (+) 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 mild L:AE,               norm RVSF,  triv panvalvular regurg; b.) TTE 12/28/2020:               EF 50%, no RWMAs, G1DD, mild LAE, norm RVSF, mild               MR/TR/PR; c.) stress TTE 07/31/2023: EF >55%, no RWMAs,               G1DD, max HR 136 bpm (MPHR 128 bpm), mod decreased ETT               (20-30%), inferolat ST depression, mild AR/TR/PR No date: Diverticulosis No date: Erectile dysfunction No date: Erythrocytosis No date: Esophagitis No date: Fatty infiltration of liver No date: Gastritis No date: GERD (gastroesophageal reflux disease) No date: Gout No date: Hyperlipidemia No date: Hypertension 06/06/2016: Idiopathic cardiomyopathy (HCC)     Comment:  a.) TTE 06/06/2016: EF 45%; b.) TTE 12/28/2020: EF 50%;               c.) stress TTE 07/31/2023: EF >55% No date: Inappropriate sinus node tachycardia (HCC) No date: Internal hemorrhoids No date: Lipoma of back No date: Long-term use of aspirin therapy No date: Melanoma of skin (HCC) No date: Osteoarthritis No date: Polycythemia vera (HCC)     Comment:  a.) Tx'd with daily dapsone No date: Pre-diabetes No date: Right rotator cuff tear No date: Shortness of breath on exertion No date: Sleep apnea     Comment:  a.) no nocturnal PAP therapy  Reproductive/Obstetrics                             Anesthesia Physical Anesthesia Plan  ASA: 2  Anesthesia Plan: General   Post-op Pain Management: Ofirmev IV (intra-op)* and Toradol IV (intra-op)*   Induction: Intravenous  PONV Risk Score and Plan: 2 and Ondansetron, Dexamethasone, Midazolam and Treatment may vary due to age or medical condition  Airway Management Planned: Oral ETT and Natural Airway  Additional Equipment: None  Intra-op Plan:   Post-operative Plan: Extubation in OR  Informed Consent: I have reviewed the patients History and Physical, chart, labs and discussed the procedure including the risks, benefits and alternatives for the proposed anesthesia with the patient or  authorized representative who has indicated his/her understanding and acceptance.     Dental advisory given  Plan Discussed with: CRNA and Surgeon  Anesthesia Plan Comments: (GETA vs natural airway depending on positioning. Discussed risks of anesthesia with patient, including PONV, sore throat, lip/dental/eye damage. Rare risks discussed as well, such as cardiorespiratory and neurological sequelae, and allergic reactions. Discussed the role of CRNA in patient's perioperative care. Patient understands.)       Anesthesia Quick Evaluation

## 2023-08-23 NOTE — Discharge Instructions (Signed)
  Diet: Resume home heart healthy regular diet.   Activity: Increase activity as tolerated.   Wound care: May shower with soapy water and pat dry (do not rub incisions), but no baths or submerging incision underwater until follow-up. (no swimming)   Medications: Resume all home medications. For mild to moderate pain: acetaminophen (Tylenol) or ibuprofen (if no kidney disease). Combining Tylenol with alcohol can substantially increase your risk of causing liver disease. Narcotic pain medications, if prescribed, can be used for severe pain, though may cause nausea, constipation, and drowsiness. Do not combine Tylenol and Norco within a 6 hour period as Norco contains Tylenol. If you do not need the narcotic pain medication, you do not need to fill the prescription.  Call office [phone]) at any time if any questions, worsening pain, fevers/chills, bleeding, drainage from incision site, or other concerns.

## 2023-08-23 NOTE — Anesthesia Procedure Notes (Signed)
Procedure Name: MAC Date/Time: 08/23/2023 12:43 PM  Performed by: Cheral Bay, CRNAPre-anesthesia Checklist: Patient identified, Emergency Drugs available, Suction available, Patient being monitored and Timeout performed Patient Re-evaluated:Patient Re-evaluated prior to induction Oxygen Delivery Method: Nasal cannula and Simple face mask Induction Type: IV induction Placement Confirmation: positive ETCO2 and CO2 detector

## 2023-08-23 NOTE — Transfer of Care (Signed)
Immediate Anesthesia Transfer of Care Note  Patient: Jorge Diaz  Procedure(s) Performed: EXCISION LIPOMA (Left: Back)  Patient Location: PACU  Anesthesia Type:General  Level of Consciousness: awake  Airway & Oxygen Therapy: Patient Spontanous Breathing and Patient connected to nasal cannula oxygen  Post-op Assessment: Report given to RN and Post -op Vital signs reviewed and stable  Post vital signs: Reviewed and stable  Last Vitals:  Vitals Value Taken Time  BP 132/96 08/23/23 1326  Temp    Pulse 97 08/23/23 1329  Resp 19 08/23/23 1329  SpO2 97 % 08/23/23 1329  Vitals shown include unfiled device data.  Last Pain:  Vitals:   08/23/23 0940  TempSrc: Tympanic  PainSc: 0-No pain         Complications: No notable events documented.

## 2023-08-23 NOTE — Op Note (Signed)
OPERATION REPORT  Pre Operative Diagnosis: Mass of back  Post operative diagnosis: Mass of back  Anesthesia: MAC and Local   Surgeon: Dr. Hazle Quant   Indication: This 73 y.o. year old male with a soft tissue mass that is causing discomfort and increasing in size.    Description of procedure: after orienting patient about the procedure steps and benefits and patient agreed to proceed. Time out was done identifying correct patient and location of procedure. After induction of monitored sedation, local anesthesia was infiltrated around the palpable lesion. With a blade #15, an elliptical incision was made using the skin lines. Sharp dissection was carried down to the intramuscular comportment deep to fascia. The mass measured 7 cm. Deep dermal stitches were done with vicryl 2-0 to repair the laceration and skin closed with Monocryl 4-0 in subcuticular fashion. Specimen sent to pathology.    Complications: none   EBL: minimal  Carolan Shiver, MD, FACS

## 2023-08-25 ENCOUNTER — Encounter: Payer: Self-pay | Admitting: General Surgery

## 2023-08-26 LAB — SURGICAL PATHOLOGY

## 2023-08-26 NOTE — Anesthesia Postprocedure Evaluation (Signed)
Anesthesia Post Note  Patient: Jorge Diaz  Procedure(s) Performed: EXCISION LIPOMA (Left: Back)  Patient location during evaluation: PACU Anesthesia Type: General Level of consciousness: awake and alert Pain management: pain level controlled Vital Signs Assessment: post-procedure vital signs reviewed and stable Respiratory status: spontaneous breathing, nonlabored ventilation, respiratory function stable and patient connected to nasal cannula oxygen Cardiovascular status: blood pressure returned to baseline and stable Postop Assessment: no apparent nausea or vomiting Anesthetic complications: no   No notable events documented.   Last Vitals:  Vitals:   08/23/23 1357 08/23/23 1409  BP: (!) 135/90 135/87  Pulse: 87 85  Resp: 19 15  Temp: (!) 36.3 C (!) 36.3 C  SpO2: 96% 97%    Last Pain:  Vitals:   08/24/23 1026  TempSrc:   PainSc: 0-No pain                 Cleda Mccreedy Erine Phenix

## 2023-10-18 ENCOUNTER — Ambulatory Visit: Payer: Medicare HMO | Admitting: Cardiovascular Disease

## 2024-09-28 ENCOUNTER — Ambulatory Visit: Admitting: Cardiovascular Disease
